# Patient Record
Sex: Female | Born: 1980 | Race: White | Hispanic: No | Marital: Single | Smoking: Current every day smoker
Health system: Southern US, Community
[De-identification: ages and names within clinical notes are randomized; demographics above are authoritative.]

## PROBLEM LIST (undated history)

## (undated) DIAGNOSIS — G459 Transient cerebral ischemic attack, unspecified: Secondary | ICD-10-CM

## (undated) DIAGNOSIS — F4310 Post-traumatic stress disorder, unspecified: Secondary | ICD-10-CM

## (undated) DIAGNOSIS — F329 Major depressive disorder, single episode, unspecified: Secondary | ICD-10-CM

## (undated) DIAGNOSIS — Z915 Personal history of self-harm: Secondary | ICD-10-CM

## (undated) DIAGNOSIS — F419 Anxiety disorder, unspecified: Secondary | ICD-10-CM

## (undated) DIAGNOSIS — G479 Sleep disorder, unspecified: Secondary | ICD-10-CM

## (undated) DIAGNOSIS — J45909 Unspecified asthma, uncomplicated: Secondary | ICD-10-CM

## (undated) DIAGNOSIS — T7421XA Adult sexual abuse, confirmed, initial encounter: Secondary | ICD-10-CM

## (undated) DIAGNOSIS — F32A Depression, unspecified: Secondary | ICD-10-CM

## (undated) DIAGNOSIS — K589 Irritable bowel syndrome without diarrhea: Secondary | ICD-10-CM

## (undated) DIAGNOSIS — K219 Gastro-esophageal reflux disease without esophagitis: Secondary | ICD-10-CM

## (undated) DIAGNOSIS — Z9151 Personal history of suicidal behavior: Secondary | ICD-10-CM

## (undated) HISTORY — PX: ABDOMINAL HYSTERECTOMY: SHX81

## (undated) HISTORY — PX: ANKLE SURGERY: SHX546

## (undated) HISTORY — PX: CHOLECYSTECTOMY: SHX55

---

## 2016-03-02 ENCOUNTER — Ambulatory Visit (INDEPENDENT_AMBULATORY_CARE_PROVIDER_SITE_OTHER): Payer: No Typology Code available for payment source

## 2016-03-02 ENCOUNTER — Encounter: Payer: Self-pay | Admitting: Gynecology

## 2016-03-02 ENCOUNTER — Ambulatory Visit
Admission: EM | Admit: 2016-03-02 | Discharge: 2016-03-02 | Disposition: A | Discharge status: Home / Self Care | Payer: No Typology Code available for payment source | Attending: Family Medicine | Admitting: Family Medicine

## 2016-03-02 DIAGNOSIS — S8391XA Sprain of unspecified site of right knee, initial encounter: Secondary | ICD-10-CM

## 2016-03-02 DIAGNOSIS — S93401A Sprain of unspecified ligament of right ankle, initial encounter: Secondary | ICD-10-CM

## 2016-03-02 HISTORY — DX: Gastro-esophageal reflux disease without esophagitis: K21.9

## 2016-03-02 HISTORY — DX: Sleep disorder, unspecified: G47.9

## 2016-03-02 HISTORY — DX: Irritable bowel syndrome, unspecified: K58.9

## 2016-03-02 HISTORY — DX: Major depressive disorder, single episode, unspecified: F32.9

## 2016-03-02 HISTORY — DX: Depression, unspecified: F32.A

## 2016-03-02 HISTORY — DX: Anxiety disorder, unspecified: F41.9

## 2016-03-02 MED ORDER — HYDROCODONE-ACETAMINOPHEN 5-325 MG PO TABS: ORAL_TABLET | ORAL | 0 refills | Status: DC

## 2016-03-02 MED ORDER — KETOROLAC TROMETHAMINE 60 MG/2ML IM SOLN
60.0000 mg | Freq: Once | INTRAMUSCULAR | Status: AC
  Administered 2016-03-02: 60 mg via INTRAMUSCULAR

## 2016-03-02 NOTE — ED Triage Notes (Signed)
Per patient fall at dad home. Patient stated hole in grass that she did not see and her right ankle got twisted. Per patients/p right ankle broken in December 2015. Patient stated has plate, screw and pins in right knee. Per patient just move x 3 days ago from [place].

## 2016-03-02 NOTE — ED Provider Notes (Signed)
[facility]    CSN: [id number] Arrival date & time: 03/02/16  1542  First Provider Contact:  First MD Initiated Contact with Patient 03/02/16 1632        History   Chief Complaint Chief Complaint  Patient presents with  . Fall  . Knee Pain    HPI Dynette Maginnis is a 35 y.o. female.   35 yo female with a c/o right knee and ankle pain after tripping in a hole in her father's yard and twisting her ankle and knee. States knee pain is worse. Denies any numbness/tingling, laceration.    The history is provided by the patient.  Fall  This is a new problem.  Knee Pain    Past Medical History:  Diagnosis Date  . Anxiety   . Depression   . GERD (gastroesophageal reflux disease)   . IBS (irritable bowel syndrome)   . Sleep disorder     There are no active problems to display for this patient.   Past Surgical History:  Procedure Laterality Date  . ANKLE SURGERY Right   . CHOLECYSTECTOMY      OB History    No data available       Home Medications    Prior to Admission medications   Medication Sig Start Date End Date Taking? Authorizing Provider  busPIRone (BUSPAR) 10 MG tablet Take 10 mg by mouth 3 (three) times daily.   Yes Historical Provider, MD  dicyclomine (BENTYL) 20 MG tablet Take 20 mg by mouth every 6 (six) hours.   Yes Historical Provider, MD  escitalopram (LEXAPRO) 20 MG tablet Take 20 mg by mouth daily.   Yes Historical Provider, MD  gabapentin (NEURONTIN) 600 MG tablet Take 600 mg by mouth 3 (three) times daily.   Yes Historical Provider, MD  hydrALAZINE (APRESOLINE) 50 MG tablet Take 50 mg by mouth 3 (three) times daily.   Yes Historical Provider, MD  pantoprazole (PROTONIX) 40 MG tablet Take 40 mg by mouth daily.   Yes Historical Provider, MD  propranolol (INDERAL) 10 MG tablet Take 10 mg by mouth 3 (three) times daily.   Yes Historical Provider, MD  SUMAtriptan (IMITREX) 100 MG tablet Take 100 mg by mouth every 2 (two) hours as needed for  migraine. May repeat in 2 hours if headache persists or recurs.   Yes Historical Provider, MD  topiramate (TOPAMAX) 200 MG tablet Take 200 mg by mouth 2 (two) times daily.   Yes Historical Provider, MD  traZODone (DESYREL) 100 MG tablet Take 100 mg by mouth at bedtime.   Yes Historical Provider, MD  HYDROcodone-acetaminophen (NORCO/VICODIN) 5-325 MG tablet 1-2 tabs po q 8 hours prn 03/02/16   Payton Mccallum, MD    Family History No family history on file.  Social History Social History  Substance Use Topics  . Smoking status: Former Games developer  . Smokeless tobacco: Never Used  . Alcohol use No     Allergies   Prozac [fluoxetine hcl]   Review of Systems Review of Systems   Physical Exam Triage Vital Signs ED Triage Vitals  Enc Vitals Group     BP 03/02/16 1616 101/78     Pulse Rate 03/02/16 1616 67     Resp 03/02/16 1616 16     Temp 03/02/16 1616 98.7 F (37.1 C)     Temp Source 03/02/16 1616 Oral     SpO2 03/02/16 1616 99 %     Weight 03/02/16 1616 200 lb (90.7 kg)  Height 03/02/16 1616  (1.727 m)     Head Circumference --      Peak Flow --      Pain Score 03/02/16 1630 8     Pain Loc --      Pain Edu? --      Excl. in GC? --    No data found.   Updated Vital Signs BP 101/78 (BP Location: Left Arm)   Pulse 67   Temp 98.7 F (37.1 C) (Oral)   Resp 16   Ht  (1.727 m)   Wt 200 lb (90.7 kg)   SpO2 99%   BMI 30.41 kg/m   Visual Acuity Right Eye Distance:   Left Eye Distance:   Bilateral Distance:    Right Eye Near:   Left Eye Near:    Bilateral Near:     Physical Exam  Constitutional: She appears well-developed and well-nourished. No distress.  Musculoskeletal:       Right knee: She exhibits decreased range of motion and swelling (mild). She exhibits no effusion, no ecchymosis, no deformity, no laceration, no erythema, normal alignment, no LCL laxity, normal patellar mobility, no bony tenderness, normal meniscus and no MCL laxity. Tenderness  (diffuse) found.       Right ankle: She exhibits decreased range of motion and swelling (mild). She exhibits no ecchymosis, no deformity, no laceration and normal pulse. Tenderness (diffuse). Achilles tendon normal.  Skin: She is not diaphoretic.  Nursing note and vitals reviewed.    UC Treatments / Results  Labs (all labs ordered are listed, but only abnormal results are displayed) Labs Reviewed - No data to display  EKG  EKG Interpretation None       Radiology Dg Ankle Complete Right  Result Date: 03/02/2016 CLINICAL DATA:  Recent fall with right ankle pain, initial encounter EXAM: RIGHT ANKLE - COMPLETE 3+ VIEW COMPARISON:  None. FINDINGS: Postsurgical changes are noted with 2 fixation screws traversing the medial malleolus as well as a tunnel fixation device involving the distal tibia and fibula. No acute fracture or dislocation is noted. No soft tissue changes are seen. IMPRESSION: Postsurgical change without acute abnormality. Electronically Signed   By: Alcide Clever M.D.   On: 03/02/2016 17:26  Dg Knee Complete 4 Views Right  Result Date: 03/02/2016 CLINICAL DATA:  Pain following fall EXAM: RIGHT KNEE - COMPLETE 4+ VIEW COMPARISON:  None. FINDINGS: Frontal, lateral, bilateral oblique, and sunrise patellar images were obtained. There is no demonstrable fracture or dislocation. No joint effusion. The joint spaces appear normal. No erosive change. Nonfusion of the anterior tibial tubercle is an anatomic variant. IMPRESSION: No fracture or joint effusion.  No apparent arthropathy. Electronically Signed   By: Bretta Bang III M.D.   On: 03/02/2016 17:29   Procedures Procedures (including critical care time)  Medications Ordered in UC Medications  ketorolac (TORADOL) injection 60 mg (60 mg Intramuscular Given 03/02/16 1644)     Initial Impression / Assessment and Plan / UC Course  I have reviewed the triage vital signs and the nursing notes.  Pertinent labs & imaging  results that were available during my care of the patient were reviewed by me and considered in my medical decision making (see chart for details).  Clinical Course      Final Clinical Impressions(s) / UC Diagnoses   Final diagnoses:  Knee sprain, right, initial encounter  Ankle sprain, right, initial encounter    New Prescriptions Discharge Medication List as of 03/02/2016  5:46  PM    START taking these medications   Details  HYDROcodone-acetaminophen (NORCO/VICODIN) 5-325 MG tablet 1-2 tabs po q 8 hours prn, Print      1. x-ray results and diagnosis reviewed with patient 2. rx as per orders above; reviewed possible side effects, interactions, risks and benefits  3. Recommend supportive treatment with rest, ice, elevation 4. Follow-up prn if symptoms worsen or don't improve   Payton Mccallum, MD 03/02/16 2023

## 2016-05-30 ENCOUNTER — Ambulatory Visit
Admission: EM | Admit: 2016-05-30 | Discharge: 2016-05-30 | Disposition: A | Discharge status: Home / Self Care | Payer: Non-veteran care | Attending: Family Medicine | Admitting: Family Medicine

## 2016-05-30 DIAGNOSIS — G43709 Chronic migraine without aura, not intractable, without status migrainosus: Secondary | ICD-10-CM | POA: Diagnosis not present

## 2016-05-30 HISTORY — DX: Transient cerebral ischemic attack, unspecified: G45.9

## 2016-05-30 MED ORDER — DEXAMETHASONE SODIUM PHOSPHATE 10 MG/ML IJ SOLN
10.0000 mg | Freq: Once | INTRAMUSCULAR | Status: AC
  Administered 2016-05-30: 10 mg via INTRAMUSCULAR

## 2016-05-30 MED ORDER — ONDANSETRON 8 MG PO TBDP
8.0000 mg | ORAL_TABLET | Freq: Once | ORAL | Status: AC
  Administered 2016-05-30: 8 mg via ORAL

## 2016-05-30 MED ORDER — DIPHENHYDRAMINE HCL 50 MG/ML IJ SOLN
50.0000 mg | Freq: Once | INTRAMUSCULAR | Status: AC
  Administered 2016-05-30: 50 mg via INTRAMUSCULAR

## 2016-05-30 MED ORDER — BUTALBITAL-APAP-CAFFEINE 50-325-40 MG PO TABS: 1.0000 | ORAL_TABLET | Freq: Four times a day (QID) | ORAL | 0 refills | Status: DC | PRN

## 2016-05-30 NOTE — ED Provider Notes (Signed)
CSN: [id number]     Arrival date & time 05/30/16  1200 History   First MD Initiated Contact with Patient 05/30/16 1333     Chief Complaint  Patient presents with  . Migraine   (Consider location/radiation/quality/duration/timing/severity/associated sxs/prior Treatment) HPI  Present with migraine Ha for 3 days unresponsive to Imitrex and NSAIDs at home. Rates as 8/10 at present. Nausea and vomiting. Not worse in her life but" up there".       Past Medical History:  Diagnosis Date  . Anxiety   . Depression   . GERD (gastroesophageal reflux disease)   . IBS (irritable bowel syndrome)   . Sleep disorder   . TIA (transient ischemic attack)    Past Surgical History:  Procedure Laterality Date  . ANKLE SURGERY Right   . CHOLECYSTECTOMY     History reviewed. No pertinent family history. Social History  Substance Use Topics  . Smoking status: Former Games developermoker  . Smokeless tobacco: Never Used  . Alcohol use No   OB History    No data available     Review of Systems  Constitutional: Positive for activity change. Negative for chills, fatigue and fever.  HENT: Positive for sinus pressure.   Neurological: Positive for weakness and headaches.  All other systems reviewed and are negative.   Allergies  Haldol [haloperidol lactate] and Prozac [fluoxetine hcl]  Home Medications   Prior to Admission medications   Medication Sig Start Date End Date Taking? Authorizing Provider  SUMAtriptan (IMITREX) 100 MG tablet Take 100 mg by mouth every 2 (two) hours as needed for migraine. May repeat in 2 hours if headache persists or recurs.   Yes Historical Provider, MD  topiramate (TOPAMAX) 200 MG tablet Take 200 mg by mouth 2 (two) times daily.   Yes Historical Provider, MD  busPIRone (BUSPAR) 10 MG tablet Take 10 mg by mouth 3 (three) times daily.    Historical Provider, MD  butalbital-acetaminophen-caffeine (FIORICET, ESGIC) 50-325-40 MG tablet Take 1-2 tablets by mouth every 6 (six) hours  as needed for headache. 05/30/16 05/30/17  Lutricia FeilWilliam P Epimenio Schetter, PA-C  dicyclomine (BENTYL) 20 MG tablet Take 20 mg by mouth every 6 (six) hours.    Historical Provider, MD  escitalopram (LEXAPRO) 20 MG tablet Take 20 mg by mouth daily.    Historical Provider, MD  gabapentin (NEURONTIN) 600 MG tablet Take 600 mg by mouth 3 (three) times daily.    Historical Provider, MD  hydrALAZINE (APRESOLINE) 50 MG tablet Take 50 mg by mouth 3 (three) times daily.    Historical Provider, MD  HYDROcodone-acetaminophen (NORCO/VICODIN) 5-325 MG tablet 1-2 tabs po q 8 hours prn 03/02/16   Payton Mccallumrlando Conty, MD  pantoprazole (PROTONIX) 40 MG tablet Take 40 mg by mouth daily.    Historical Provider, MD  propranolol (INDERAL) 10 MG tablet Take 10 mg by mouth 3 (three) times daily.    Historical Provider, MD  traZODone (DESYREL) 100 MG tablet Take 100 mg by mouth at bedtime.    Historical Provider, MD   Meds Ordered and Administered this Visit   Medications  dexamethasone (DECADRON) injection 10 mg (10 mg Intramuscular Given 05/30/16 1357)  diphenhydrAMINE (BENADRYL) injection 50 mg (50 mg Intramuscular Given 05/30/16 1357)  ondansetron (ZOFRAN-ODT) disintegrating tablet 8 mg (8 mg Oral Given 05/30/16 1357)    BP 108/65 (BP Location: Left Arm)   Pulse 82   Temp 98.2 F (36.8 C) (Oral)   Resp 18   Ht 5\' 9"  (1.753 m)   Wt  200 lb (90.7 kg)   SpO2 97%   BMI 29.53 kg/m  No data found.   Physical Exam  Constitutional: She is oriented to person, place, and time. She appears well-developed and well-nourished. No distress.  HENT:  Head: Normocephalic and atraumatic.  Right Ear: External ear normal.  Left Ear: External ear normal.  Eyes: EOM are normal. Pupils are equal, round, and reactive to light. Right eye exhibits no discharge. Left eye exhibits no discharge.  Neck: Normal range of motion. Neck supple.  Musculoskeletal: Normal range of motion.  Neurological: She is alert and oriented to person, place, and time.  No cranial nerve deficit. She exhibits normal muscle tone. Coordination normal.  Skin: Skin is warm and dry. She is not diaphoretic.  Psychiatric: She has a normal mood and affect. Her behavior is normal. Judgment and thought content normal.  Nursing note and vitals reviewed.   Urgent Care Course   Clinical Course    Procedures (including critical care time)  Labs Review Labs Reviewed - No data to display  Imaging Review No results found.   Visual Acuity Review  Right Eye Distance:   Left Eye Distance:   Bilateral Distance:    Right Eye Near:   Left Eye Near:    Bilateral Near:     Medications  dexamethasone (DECADRON) injection 10 mg (10 mg Intramuscular Given 05/30/16 1357)  diphenhydrAMINE (BENADRYL) injection 50 mg (50 mg Intramuscular Given 05/30/16 1357)  ondansetron (ZOFRAN-ODT) disintegrating tablet 8 mg (8 mg Oral Given 05/30/16 1357)   Patient did not have any relief of pain after 30 minutes following the medication. Continues to rate her headache is an 8 out of 10   MDM   1. Chronic migraine without aura without status migrainosus, not intractable    Discharge Medication List as of 05/30/2016  2:43 PM    START taking these medications   Details  butalbital-acetaminophen-caffeine (FIORICET, ESGIC) 50-325-40 MG tablet Take 1-2 tablets by mouth every 6 (six) hours as needed for headache., Starting Sun 05/30/2016, Until Mon 05/30/2017, Print      Plan: 1. Test/x-ray results and diagnosis reviewed with patient 2. rx as per orders; risks, benefits, potential side effects reviewed with patient 3. Recommend supportive treatment with  Use of prescribed medicine if after one dose does not feel improvement I recommended highly she go to the emergency room. Recommended specifically that she go the emergency room from here but she declined saying she had no way of getting there are father would not take her and she has no friends since she just recently moved to the  area. Spite my recommendation that she go the emergency room she wanted only to have a medication that she can take at home. When I did prescribe the Fioricet   she researched [employer] and stated that he read that Fioricet with codeine works better. I told her I will not prescribe codeine for her and she will have to settle for Fioricet but again reiterated that she should go to the emergency room.   Lutricia Feil, PA-C 05/30/16 1657    Lutricia Feil, PA-C 05/30/16 [phone]

## 2016-05-30 NOTE — ED Triage Notes (Signed)
Pt complains of a migraine for about 3 days. Now her neck is sore and lower back.

## 2016-10-15 ENCOUNTER — Ambulatory Visit (INDEPENDENT_AMBULATORY_CARE_PROVIDER_SITE_OTHER): Payer: Non-veteran care

## 2016-10-15 ENCOUNTER — Ambulatory Visit
Admission: EM | Admit: 2016-10-15 | Discharge: 2016-10-15 | Disposition: A | Discharge status: Home / Self Care | Payer: Non-veteran care | Attending: Family Medicine | Admitting: Family Medicine

## 2016-10-15 DIAGNOSIS — F329 Major depressive disorder, single episode, unspecified: Secondary | ICD-10-CM | POA: Diagnosis not present

## 2016-10-15 DIAGNOSIS — K219 Gastro-esophageal reflux disease without esophagitis: Secondary | ICD-10-CM | POA: Insufficient documentation

## 2016-10-15 DIAGNOSIS — R079 Chest pain, unspecified: Secondary | ICD-10-CM | POA: Diagnosis present

## 2016-10-15 DIAGNOSIS — R05 Cough: Secondary | ICD-10-CM

## 2016-10-15 DIAGNOSIS — F419 Anxiety disorder, unspecified: Secondary | ICD-10-CM | POA: Diagnosis not present

## 2016-10-15 DIAGNOSIS — Z79899 Other long term (current) drug therapy: Secondary | ICD-10-CM | POA: Diagnosis not present

## 2016-10-15 DIAGNOSIS — Z888 Allergy status to other drugs, medicaments and biological substances status: Secondary | ICD-10-CM | POA: Diagnosis not present

## 2016-10-15 DIAGNOSIS — R091 Pleurisy: Secondary | ICD-10-CM | POA: Insufficient documentation

## 2016-10-15 DIAGNOSIS — M940 Chondrocostal junction syndrome [Tietze]: Secondary | ICD-10-CM

## 2016-10-15 DIAGNOSIS — Z8673 Personal history of transient ischemic attack (TIA), and cerebral infarction without residual deficits: Secondary | ICD-10-CM | POA: Diagnosis not present

## 2016-10-15 DIAGNOSIS — K589 Irritable bowel syndrome without diarrhea: Secondary | ICD-10-CM | POA: Diagnosis not present

## 2016-10-15 DIAGNOSIS — Z87891 Personal history of nicotine dependence: Secondary | ICD-10-CM | POA: Diagnosis not present

## 2016-10-15 LAB — CBC WITH DIFFERENTIAL/PLATELET
BASOS ABS: 0.1 10*3/uL (ref 0–0.1)
Basophils Relative: 1 %
EOS PCT: 3 %
Eosinophils Absolute: 0.2 10*3/uL (ref 0–0.7)
HCT: 38.9 % (ref 35.0–47.0)
HEMOGLOBIN: 13.1 g/dL (ref 12.0–16.0)
LYMPHS PCT: 28 %
Lymphs Abs: 1.9 10*3/uL (ref 1.0–3.6)
MCH: 30.9 pg (ref 26.0–34.0)
MCHC: 33.6 g/dL (ref 32.0–36.0)
MCV: 91.9 fL (ref 80.0–100.0)
Monocytes Absolute: 0.5 10*3/uL (ref 0.2–0.9)
Monocytes Relative: 7 %
NEUTROS PCT: 61 %
Neutro Abs: 4 10*3/uL (ref 1.4–6.5)
PLATELETS: 183 10*3/uL (ref 150–440)
RBC: 4.23 MIL/uL (ref 3.80–5.20)
RDW: 14.5 % (ref 11.5–14.5)
WBC: 6.7 10*3/uL (ref 3.6–11.0)

## 2016-10-15 LAB — COMPREHENSIVE METABOLIC PANEL
ALT: 28 U/L (ref 14–54)
AST: 29 U/L (ref 15–41)
Albumin: 4 g/dL (ref 3.5–5.0)
Alkaline Phosphatase: 87 U/L (ref 38–126)
Anion gap: 8 (ref 5–15)
BUN: 15 mg/dL (ref 6–20)
CHLORIDE: 103 mmol/L (ref 101–111)
CO2: 26 mmol/L (ref 22–32)
CREATININE: 1.02 mg/dL — AB (ref 0.44–1.00)
Calcium: 8.8 mg/dL — ABNORMAL LOW (ref 8.9–10.3)
GFR calc Af Amer: >60 mL/min (ref >60)
GFR calc non Af Amer: >60 mL/min (ref >60)
Glucose, Bld: 106 mg/dL — ABNORMAL HIGH (ref 65–99)
Potassium: 4 mmol/L (ref 3.5–5.1)
SODIUM: 137 mmol/L (ref 135–145)
Total Bilirubin: 0.2 mg/dL — ABNORMAL LOW (ref 0.3–1.2)
Total Protein: 6.8 g/dL (ref 6.5–8.1)

## 2016-10-15 LAB — URINALYSIS, COMPLETE (UACMP) WITH MICROSCOPIC
BACTERIA UA: NONE SEEN
Bilirubin Urine: NEGATIVE
Glucose, UA: NEGATIVE mg/dL
KETONES UR: NEGATIVE mg/dL
Leukocytes, UA: NEGATIVE
Nitrite: NEGATIVE
PROTEIN: NEGATIVE mg/dL
Specific Gravity, Urine: >1.03 — ABNORMAL HIGH (ref 1.005–1.030)
pH: 5.5 (ref 5.0–8.0)

## 2016-10-15 LAB — FIBRIN DERIVATIVES D-DIMER (ARMC ONLY): Fibrin derivatives D-dimer (ARMC): 337.21 (ref 0.00–499.00)

## 2016-10-15 MED ORDER — HYDROCODONE-ACETAMINOPHEN 5-325 MG PO TABS: ORAL_TABLET | ORAL | 0 refills | Status: DC

## 2016-10-15 MED ORDER — PROMETHAZINE HCL 25 MG/ML IJ SOLN
25.0000 mg | Freq: Once | INTRAMUSCULAR | Status: AC
  Administered 2016-10-15: 25 mg via INTRAMUSCULAR

## 2016-10-15 MED ORDER — KETOROLAC TROMETHAMINE 60 MG/2ML IM SOLN
60.0000 mg | Freq: Once | INTRAMUSCULAR | Status: AC
  Administered 2016-10-15: 60 mg via INTRAMUSCULAR

## 2016-10-15 MED ORDER — PREDNISONE 20 MG PO TABS: 20.0000 mg | ORAL_TABLET | Freq: Every day | ORAL | 0 refills | Status: DC

## 2016-10-15 MED ORDER — ACETAMINOPHEN 500 MG PO TABS
1000.0000 mg | ORAL_TABLET | Freq: Once | ORAL | Status: AC
Start: 2016-10-15 — End: 2016-10-15
  Administered 2016-10-15: 1000 mg via ORAL

## 2016-10-15 MED ORDER — AZITHROMYCIN 250 MG PO TABS: ORAL_TABLET | ORAL | 0 refills | Status: DC

## 2016-10-15 MED ORDER — PROMETHAZINE HCL 25 MG/ML IJ SOLN: 25.0000 mg | Freq: Once | INTRAMUSCULAR | Status: DC

## 2016-10-15 NOTE — ED Triage Notes (Signed)
Pt c/o sharp chest pain on the right side, she says it started about an hour ago and just keeps getting worse. Has history of pleurisy for about 2 months. She is just getting over this.

## 2016-12-15 NOTE — ED Provider Notes (Addendum)
[facility]    CSN: [id number] Arrival date & time: 10/15/16  1712     History   Chief Complaint Chief Complaint  Patient presents with  . Chest Pain    HPI Kylie Lam is a 36 y.o. female.   The history is provided by the patient.  Chest Pain  Pain location:  R chest Pain quality: sharp   Pain severity:  Moderate Onset quality:  Sudden Duration:  3 hours Timing:  Constant Progression:  Worsening Chronicity:  New Context: breathing, movement and at rest   Context: not drug use, not eating, not intercourse, not lifting, not raising an arm, not stress and not trauma   Relieved by:  None tried Worsened by:  Deep breathing, coughing and movement Associated symptoms: cough   Associated symptoms: no abdominal pain, no AICD problem, no altered mental status, no anorexia, no anxiety, no back pain, no claudication, no diaphoresis, no dizziness, no dysphagia, no fatigue, no fever, no headache, no heartburn, no lower extremity edema, no nausea, no near-syncope, no numbness, no orthopnea, no palpitations, no PND, no shortness of breath, no syncope, no vomiting and no weakness   Risk factors: no aortic disease, no birth control, no coronary artery disease, no diabetes mellitus, no Ehlers-Danlos syndrome, no high cholesterol, no hypertension, no immobilization, not female, no Marfan's syndrome, not obese, not pregnant, no prior DVT/PE, no smoking and no surgery     Past Medical History:  Diagnosis Date  . Anxiety   . Depression   . GERD (gastroesophageal reflux disease)   . IBS (irritable bowel syndrome)   . Sleep disorder   . TIA (transient ischemic attack)     There are no active problems to display for this patient.   Past Surgical History:  Procedure Laterality Date  . ANKLE SURGERY Right   . CHOLECYSTECTOMY      OB History    No data available       Home Medications    Prior to Admission medications   Medication Sig Start Date End Date Taking?  Authorizing Provider  busPIRone (BUSPAR) 10 MG tablet Take 10 mg by mouth 3 (three) times daily.   Yes [provider]  dicyclomine (BENTYL) 20 MG tablet Take 20 mg by mouth every 6 (six) hours.   Yes [provider]  escitalopram (LEXAPRO) 20 MG tablet Take 20 mg by mouth daily.   Yes [provider]  gabapentin (NEURONTIN) 600 MG tablet Take 600 mg by mouth 3 (three) times daily.   Yes [provider]  pantoprazole (PROTONIX) 40 MG tablet Take 40 mg by mouth daily.   Yes [provider]  SUMAtriptan (IMITREX) 100 MG tablet Take 100 mg by mouth every 2 (two) hours as needed for migraine. May repeat in 2 hours if headache persists or recurs.   Yes [provider]  topiramate (TOPAMAX) 200 MG tablet Take 200 mg by mouth 2 (two) times daily.   Yes [provider]  traZODone (DESYREL) 100 MG tablet Take 100 mg by mouth at bedtime.   Yes [provider]  azithromycin (ZITHROMAX Z-PAK) 250 MG tablet 2 tabs po once day 1, then 1 tab po qd for next 4 days 10/15/16   Payton Mccallum, MD  HYDROcodone-acetaminophen (NORCO/VICODIN) 5-325 MG tablet 1-2 tabs po bid prn 10/15/16   Payton Mccallum, MD  predniSONE (DELTASONE) 20 MG tablet Take 1 tablet (20 mg total) by mouth daily. 10/15/16   Payton Mccallum, MD  Family History History reviewed. No pertinent family history.  Social History Social History  Substance Use Topics  . Smoking status: Former Smoker  . Smokeless tobaGames developercco: Never Used  . Alcohol use No     Allergies   Haldol [haloperidol lactate] and Prozac [fluoxetine hcl]   Review of Systems Review of Systems  Constitutional: Negative for diaphoresis, fatigue and fever.  HENT: Negative for trouble swallowing.   Respiratory: Positive for cough. Negative for shortness of breath.   Cardiovascular: Positive for chest pain. Negative for palpitations, orthopnea, claudication, syncope, PND and near-syncope.  Gastrointestinal:  Negative for abdominal pain, anorexia, heartburn, nausea and vomiting.  Musculoskeletal: Negative for back pain.  Neurological: Negative for dizziness, weakness, numbness and headaches.     Physical Exam Triage Vital Signs ED Triage Vitals  Enc Vitals Group     BP 10/15/16 1715 109/71     Pulse Rate 10/15/16 1715 85     Resp 10/15/16 1715 18     Temp 10/15/16 1715 98 F (36.7 C)     Temp Source 10/15/16 1715 Oral     SpO2 10/15/16 1715 98 %     Weight 10/15/16 1714 190 lb (86.2 kg)     Height 10/15/16 1713 5\' 9"  (1.753 m)     Head Circumference --      Peak Flow --      Pain Score 10/15/16 1716 10     Pain Loc --      Pain Edu? --      Excl. in GC? --    No data found.   Updated Vital Signs BP 109/71 (BP Location: Right Arm)   Pulse 85   Temp 98 F (36.7 C) (Oral)   Resp 18   Ht 5\' 9"  (1.753 m)   Wt 190 lb (86.2 kg)   SpO2 98%   BMI 28.06 kg/m   Visual Acuity Right Eye Distance:   Left Eye Distance:   Bilateral Distance:    Right Eye Near:   Left Eye Near:    Bilateral Near:     Physical Exam  Constitutional: She appears well-developed and well-nourished. No distress.  HENT:  Head: Normocephalic and atraumatic.  Right Ear: Tympanic membrane, external ear and ear canal normal.  Left Ear: Tympanic membrane, external ear and ear canal normal.  Nose: No mucosal edema, rhinorrhea, nose lacerations, sinus tenderness, nasal deformity, septal deviation or nasal septal hematoma. No epistaxis.  No foreign bodies. Right sinus exhibits no maxillary sinus tenderness and no frontal sinus tenderness. Left sinus exhibits no maxillary sinus tenderness and no frontal sinus tenderness.  Mouth/Throat: Uvula is midline, oropharynx is clear and moist and mucous membranes are normal. No oropharyngeal exudate.  Eyes: Conjunctivae and EOM are normal. Pupils are equal, round, and reactive to light. Right eye exhibits no discharge. Left eye exhibits no discharge. No scleral icterus.    Neck: Normal range of motion. Neck supple. No thyromegaly present.  Cardiovascular: Normal rate, regular rhythm and normal heart sounds.   Pulmonary/Chest: Effort normal. No respiratory distress. She has no wheezes. She has no rales.  Rhonchi right base  Lymphadenopathy:    She has no cervical adenopathy.  Skin: She is not diaphoretic. No erythema.  Nursing note and vitals reviewed.    UC Treatments / Results  Labs (all labs ordered are listed, but only abnormal results are displayed) Labs Reviewed  COMPREHENSIVE METABOLIC PANEL - Abnormal; Notable for the following:       Result Value  Glucose, Bld 106 (*)    Creatinine, Ser 1.02 (*)    Calcium 8.8 (*)    Total Bilirubin 0.2 (*)    All other components within normal limits  URINALYSIS, COMPLETE (UACMP) WITH MICROSCOPIC - Abnormal; Notable for the following:    Specific Gravity, Urine >1.030 (*)    Hgb urine dipstick TRACE (*)    Squamous Epithelial / LPF 0-5 (*)    All other components within normal limits  CBC WITH DIFFERENTIAL/PLATELET  FIBRIN DERIVATIVES D-DIMER [facility] ONLY)    EKG  EKG Interpretation None       Radiology No results found.  Procedures .EKG Date/Time: 12/15/2016 6:44 PM Performed by: Payton Mccallum Authorized by: Payton Mccallum   ECG reviewed by ED Physician in the absence of a cardiologist: yes   Previous ECG:    Previous ECG:  Unavailable Interpretation:    Interpretation: normal   Rate:    ECG rate assessment: normal   Rhythm:    Rhythm: sinus rhythm   Ectopy:    Ectopy: none   QRS:    QRS axis:  Normal Conduction:    Conduction: normal   ST segments:    ST segments:  Normal T waves:    T waves: normal       (including critical care time)  Medications Ordered in UC Medications  ketorolac (TORADOL) injection 60 mg (60 mg Intramuscular Given 10/15/16 1742)  acetaminophen (TYLENOL) tablet 1,000 mg (1,000 mg Oral Given 10/15/16 1835)  promethazine (PHENERGAN) injection 25 mg  (25 mg Intramuscular Given 10/15/16 1920)     Initial Impression / Assessment and Plan / UC Course  I have reviewed the triage vital signs and the nursing notes.  Pertinent labs & imaging results that were available during my care of the patient were reviewed by me and considered in my medical decision making (see chart for details).      Final Clinical Impressions(s) / UC Diagnoses   Final diagnoses:  Pleurisy  Costochondritis    New Prescriptions Discharge Medication List as of 10/15/2016  7:19 PM    START taking these medications   Details  azithromycin (ZITHROMAX Z-PAK) 250 MG tablet 2 tabs po once day 1, then 1 tab po qd for next 4 days, Normal    HYDROcodone-acetaminophen (NORCO/VICODIN) 5-325 MG tablet 1-2 tabs po bid prn, Print    predniSONE (DELTASONE) 20 MG tablet Take 1 tablet (20 mg total) by mouth daily., Starting Fri 10/15/2016, Normal       1. Labs/x-ray/ekg results and diagnosis reviewed with patient 2. Given toradol 60mg  im x1 and phenergan 25mg  im x 1 3. rx as per orders above; reviewed possible side effects, interactions, risks and benefits  3. Recommend supportive treatment with rest, ice 4. Follow-up prn if symptoms worsen or don't improve   Payton Mccallum, MD 12/15/16 1850     Payton Mccallum, MD 12/15/16 Lynelle Smoke    Payton Mccallum, MD 12/15/16 [phone]

## 2016-12-31 ENCOUNTER — Ambulatory Visit
Admission: EM | Admit: 2016-12-31 | Discharge: 2016-12-31 | Disposition: A | Discharge status: Home / Self Care | Payer: Non-veteran care | Attending: Internal Medicine | Admitting: Internal Medicine

## 2016-12-31 ENCOUNTER — Encounter: Payer: Self-pay | Admitting: *Deleted

## 2016-12-31 ENCOUNTER — Ambulatory Visit (INDEPENDENT_AMBULATORY_CARE_PROVIDER_SITE_OTHER): Payer: Non-veteran care

## 2016-12-31 DIAGNOSIS — W19XXXA Unspecified fall, initial encounter: Secondary | ICD-10-CM | POA: Diagnosis not present

## 2016-12-31 DIAGNOSIS — M25522 Pain in left elbow: Secondary | ICD-10-CM

## 2016-12-31 DIAGNOSIS — S5002XA Contusion of left elbow, initial encounter: Secondary | ICD-10-CM | POA: Diagnosis not present

## 2016-12-31 MED ORDER — KETOROLAC TROMETHAMINE 60 MG/2ML IM SOLN
60.0000 mg | Freq: Once | INTRAMUSCULAR | Status: AC
  Administered 2016-12-31: 60 mg via INTRAMUSCULAR

## 2016-12-31 MED ORDER — HYDROCODONE-ACETAMINOPHEN 5-325 MG PO TABS: 1.0000 | ORAL_TABLET | Freq: Four times a day (QID) | ORAL | 0 refills | Status: DC | PRN

## 2016-12-31 MED ORDER — NAPROXEN 500 MG PO TABS: 500.0000 mg | ORAL_TABLET | Freq: Two times a day (BID) | ORAL | 0 refills | Status: DC

## 2016-12-31 NOTE — ED Provider Notes (Signed)
CSN: [id number]     Arrival date & time 12/31/16  1122 History   First MD Initiated Contact with Patient 12/31/16 1159     Chief Complaint  Patient presents with  . Joint Swelling   (Consider location/radiation/quality/duration/timing/severity/associated sxs/prior Treatment) HPI  This a 36 year old female who states that she fell yesterday when she was watering her father's yard. Her left ankle twisted into eversion she lost her balance falling onto her left nondominant elbow. She had pain immediately that has persisted since that time. She has been using ice and elevation. She states that she does not have a strong grip strength on that arm painful with any motion.        Past Medical History:  Diagnosis Date  . Anxiety   . Depression   . GERD (gastroesophageal reflux disease)   . IBS (irritable bowel syndrome)   . Sleep disorder   . TIA (transient ischemic attack)    Past Surgical History:  Procedure Laterality Date  . ANKLE SURGERY Right   . CHOLECYSTECTOMY     History reviewed. No pertinent family history. Social History  Substance Use Topics  . Smoking status: Current Every Day Smoker    Packs/day: 1.00    Types: Cigarettes  . Smokeless tobacco: Never Used  . Alcohol use No   OB History    No data available     Review of Systems  Constitutional: Positive for activity change. Negative for chills, fatigue and fever.  Musculoskeletal: Positive for arthralgias and joint swelling.  All other systems reviewed and are negative.   Allergies  Haldol [haloperidol lactate] and Prozac [fluoxetine hcl]  Home Medications   Prior to Admission medications   Medication Sig Start Date End Date Taking? Authorizing Provider  escitalopram (LEXAPRO) 20 MG tablet Take 20 mg by mouth daily.   Yes [provider]  gabapentin (NEURONTIN) 600 MG tablet Take 600 mg by mouth 3 (three) times daily.   Yes [provider]  pantoprazole (PROTONIX) 40 MG tablet Take 40  mg by mouth daily.   Yes [provider]  prazosin (MINIPRESS) 5 MG capsule Take 12 mg by mouth at bedtime.   Yes [provider]  topiramate (TOPAMAX) 200 MG tablet Take 200 mg by mouth 2 (two) times daily.   Yes [provider]  traZODone (DESYREL) 100 MG tablet Take 100 mg by mouth at bedtime.   Yes [provider]  azithromycin (ZITHROMAX Z-PAK) 250 MG tablet 2 tabs po once day 1, then 1 tab po qd for next 4 days 10/15/16   Payton Mccallumonty, [place], MD  busPIRone (BUSPAR) 10 MG tablet Take 10 mg by mouth 3 (three) times daily.    [provider]  dicyclomine (BENTYL) 20 MG tablet Take 20 mg by mouth every 6 (six) hours.    [provider]  HYDROcodone-acetaminophen (NORCO/VICODIN) 5-325 MG tablet Take 1-2 tablets by mouth every 6 (six) hours as needed. 12/31/16   Lutricia Feiloemer, Dura Mccormack P, PA-C  naproxen (NAPROSYN) 500 MG tablet Take 1 tablet (500 mg total) by mouth 2 (two) times daily with a meal. 12/31/16   Lutricia Feiloemer, Sirus Labrie P, PA-C  predniSONE (DELTASONE) 20 MG tablet Take 1 tablet (20 mg total) by mouth daily. 10/15/16   Payton Mccallumonty, [place], MD  SUMAtriptan (IMITREX) 100 MG tablet Take 100 mg by mouth every 2 (two) hours as needed for migraine. May repeat in 2 hours if headache persists or recurs.    [provider]   Meds Ordered and  Administered this Visit   Medications  ketorolac (TORADOL) injection 60 mg (60 mg Intramuscular Given 12/31/16 1218)    BP 136/86 (BP Location: Right Arm)   Pulse 85   Temp 98.9 F (37.2 C) (Oral)   Resp 18   Ht 5\' 9"  (1.753 m)   Wt 190 lb (86.2 kg)   SpO2 99%   BMI 28.06 kg/m  No data found.   Physical Exam  Constitutional: She is oriented to person, place, and time. She appears well-developed and well-nourished. No distress.  HENT:  Head: Normocephalic and atraumatic.  Eyes: Pupils are equal, round, and reactive to light.  Neck: Normal range of motion.  Musculoskeletal:  Examination of the left nondominant  elbow shows ecchymosis and swelling about the elbow itself. She is tender posteriorly as well as over the olecranon. She has a limited range of motion that she can go through passively and actively. Pronation supination is intact with discomfort at the extremes of both. Strength distally is intact. She has normal sensation distally.  Neurological: She is alert and oriented to person, place, and time.  Skin: Skin is warm and dry. She is not diaphoretic.  Psychiatric: She has a normal mood and affect. Her behavior is normal. Judgment and thought content normal.  Nursing note and vitals reviewed.   Urgent Care Course     Procedures (including critical care time)  Labs Review Labs Reviewed - No data to display  Imaging Review Dg Elbow Complete Left  Result Date: 12/31/2016 CLINICAL DATA:  36 year old who fell while in her garden yesterday, injuring the left elbow when she tried to break the fall. Initial encounter. EXAM: LEFT ELBOW - COMPLETE 3+ VIEW COMPARISON:  None. FINDINGS: Elevation of the anterior and posterior fat pads, indicating a joint effusion and/or hemarthrosis. No visible acute fracture. Well preserved joint spaces. Well-preserved bone mineral density. IMPRESSION: No visible acute fracture. A joint effusion/hemarthrosis is present, so an occult fracture cannot be excluded but is not suspected. Electronically Signed   By: Hulan Saas M.D.   On: 12/31/2016 12:50     Visual Acuity Review  Right Eye Distance:   Left Eye Distance:   Bilateral Distance:    Right Eye Near:   Left Eye Near:    Bilateral Near:     Medications  ketorolac (TORADOL) injection 60 mg (60 mg Intramuscular Given 12/31/16 1218)      MDM   1. Contusion of left elbow, initial encounter    Discharge Medication List as of 12/31/2016  1:16 PM    START taking these medications   Details  naproxen (NAPROSYN) 500 MG tablet Take 1 tablet (500 mg total) by mouth 2 (two) times daily with a meal.,  Starting Fri 12/31/2016, Normal      Plan: 1. Test/x-ray results and diagnosis reviewed with patient 2. rx as per orders; risks, benefits, potential side effects reviewed with patient 3. Recommend supportive treatment with ice and elevation. Splint for active periods but needs to come out frequently for range of motion exercises to prevent stiffness. Reminded to also move fingers wrist and shoulder through full range . She is a disabled veteran and obtains most of her medical care through the [place]. I told her she continues to have problems she [facility]. She should wean the use of the sling and splint as she is able to tolerate more motion and comfort. 4. F/u prn if symptoms worsen or don't improve  Lutricia Feil, PA-C 12/31/16 1336

## 2016-12-31 NOTE — ED Triage Notes (Signed)
Patient fell yesterday while watering her yard injuring her left elbow. OTC medication do not resolved pain. No previous history of left elbow injury.

## 2017-01-12 ENCOUNTER — Ambulatory Visit (INDEPENDENT_AMBULATORY_CARE_PROVIDER_SITE_OTHER): Payer: Non-veteran care

## 2017-01-12 ENCOUNTER — Encounter: Payer: Self-pay | Admitting: *Deleted

## 2017-01-12 ENCOUNTER — Ambulatory Visit
Admission: EM | Admit: 2017-01-12 | Discharge: 2017-01-12 | Disposition: A | Discharge status: Home / Self Care | Payer: Non-veteran care | Attending: Family Medicine | Admitting: Family Medicine

## 2017-01-12 DIAGNOSIS — S92515A Nondisplaced fracture of proximal phalanx of left lesser toe(s), initial encounter for closed fracture: Secondary | ICD-10-CM

## 2017-01-12 MED ORDER — HYDROCODONE-ACETAMINOPHEN 5-325 MG PO TABS: 2.0000 | ORAL_TABLET | ORAL | 0 refills | Status: DC | PRN

## 2017-01-12 NOTE — ED Triage Notes (Signed)
Patient injured her left pinky toe last pm when she hit it on a door jam.

## 2017-01-12 NOTE — ED Provider Notes (Signed)
CSN: [id number]     Arrival date & time 01/12/17  [id number] History   First MD Initiated Contact with Patient 01/12/17 1044     Chief Complaint  Patient presents with  . Toe Injury   (Consider location/radiation/quality/duration/timing/severity/associated sxs/prior Treatment) HPI  This is a 36 year old female recently seen at this facility for a contusion/possible fracture of left elbow when she fell while watering plants in her father sharp. He states that last night she awoke to use the restroom and in walking to the restroom her left little toe on the door jam causing it to become displaced. The patient to reach down and put the toe back into position which was very painful. Since that time she's had a bruising and swelling mostly over the fifth toe.     Past Medical History:  Diagnosis Date  . Anxiety   . Depression   . GERD (gastroesophageal reflux disease)   . IBS (irritable bowel syndrome)   . Sleep disorder   . TIA (transient ischemic attack)    Past Surgical History:  Procedure Laterality Date  . ANKLE SURGERY Right   . CHOLECYSTECTOMY     History reviewed. No pertinent family history. Social History  Substance Use Topics  . Smoking status: Current Every Day Smoker    Packs/day: 1.00    Types: Cigarettes  . Smokeless tobacco: Never Used  . Alcohol use No   OB History    No data available     Review of Systems  Constitutional: Positive for activity change. Negative for chills, fatigue and fever.  Musculoskeletal: Positive for arthralgias and gait problem.  All other systems reviewed and are negative.   Allergies  Haldol [haloperidol lactate] and Prozac [fluoxetine hcl]  Home Medications   Prior to Admission medications   Medication Sig Start Date End Date Taking? Authorizing Provider  escitalopram (LEXAPRO) 20 MG tablet Take 20 mg by mouth daily.   Yes [provider]  gabapentin (NEURONTIN) 600 MG tablet Take 600 mg by mouth 3 (three) times daily.    Yes [provider]  naproxen (NAPROSYN) 500 MG tablet Take 1 tablet (500 mg total) by mouth 2 (two) times daily with a meal. 12/31/16  Yes Lutricia Feiloemer, Tristy Udovich P, PA-C  pantoprazole (PROTONIX) 40 MG tablet Take 40 mg by mouth daily.   Yes [provider]  prazosin (MINIPRESS) 5 MG capsule Take 12 mg by mouth at bedtime.   Yes [provider]  predniSONE (DELTASONE) 20 MG tablet Take 1 tablet (20 mg total) by mouth daily. 10/15/16  Yes Payton Mccallumonty, [place], MD  topiramate (TOPAMAX) 200 MG tablet Take 200 mg by mouth 2 (two) times daily.   Yes [provider]  traZODone (DESYREL) 100 MG tablet Take 100 mg by mouth at bedtime.   Yes [provider]  azithromycin (ZITHROMAX Z-PAK) 250 MG tablet 2 tabs po once day 1, then 1 tab po qd for next 4 days 10/15/16   Payton Mccallumonty, [place], MD  busPIRone (BUSPAR) 10 MG tablet Take 10 mg by mouth 3 (three) times daily.    [provider]  dicyclomine (BENTYL) 20 MG tablet Take 20 mg by mouth every 6 (six) hours.    [provider]  HYDROcodone-acetaminophen (NORCO/VICODIN) 5-325 MG tablet Take 2 tablets by mouth every 4 (four) hours as needed. 01/12/17   Lutricia Feiloemer, Macdonald Rigor P, PA-C  SUMAtriptan (IMITREX) 100 MG tablet Take 100 mg by mouth every 2 (two) hours as needed for migraine. May repeat in 2 hours  if headache persists or recurs.    [provider]   Meds Ordered and Administered this Visit  Medications - No data to display  BP 121/72 (BP Location: Right Arm)   Pulse 77   Temp 97 F (36.1 C) (Oral)   Resp 16   SpO2 97%  No data found.   Physical Exam  Constitutional: She is oriented to person, place, and time. She appears well-developed and well-nourished. No distress.  HENT:  Head: Normocephalic.  Eyes: Pupils are equal, round, and reactive to light.  Neck: Normal range of motion.  Musculoskeletal: She exhibits edema and tenderness. She exhibits no deformity.  Neurological: She is alert and oriented  to person, place, and time.  Skin: Skin is warm and dry. She is not diaphoretic.  Psychiatric: She has a normal mood and affect. Her behavior is normal. Judgment and thought content normal.  Nursing note and vitals reviewed.   Urgent Care Course     Procedures (including critical care time)  Labs Review Labs Reviewed - No data to display  Imaging Review Dg Foot Complete Left  Result Date: 01/12/2017 CLINICAL DATA:  Hit fifth toe on door.  Pain and bruising. EXAM: LEFT FOOT - COMPLETE 3+ VIEW COMPARISON:  None. FINDINGS: There is a fracture through the proximal phalanx of the left fifth toe. This is nondisplaced. No visible intra-articular extension. No subluxation or dislocation. IMPRESSION: Nondisplaced left fifth toe proximal phalangeal fracture. Electronically Signed   By: Charlett Nose M.D.   On: 01/12/2017 10:58     Visual Acuity Review  Right Eye Distance:   Left Eye Distance:   Bilateral Distance:    Right Eye Near:   Left Eye Near:    Bilateral Near:     Patients [place] substance abuse profile was examined. No red flags were seen. Her last prescription of hydrocodone was prescribed by myself May 25. This was for a possible contusion fracture of her left elbow. All other previous medications are issued through the [facility] in [place] from [email or web address]  Patient's left fifth toe was buddy taped to the fourth toe for support. She was placed into a postop shoe for better support. Patient was given a x-ray disc for her to take to the [facility] in [place] tomorrow.  MDM   1. Closed nondisplaced fracture of proximal phalanx of lesser toe of left foot, initial encounter    Discharge Medication List as of 01/12/2017 11:44 AM    Plan: 1. Test/x-ray results and diagnosis reviewed with patient 2. rx as per orders; risks, benefits, potential side effects reviewed with patient 3. Recommend supportive treatment with Elevation and ice. She will apply ice 20  minutes out of every 2 hours 4 times daily. Follow-up will be with the [facility] in [facility]. `She'll be seen for both her elbow and her toe fracture. 4. F/u prn if symptoms worsen or don't improve     Lutricia Feil, PA-C 01/12/17 1210

## 2017-04-18 ENCOUNTER — Encounter: Payer: Self-pay | Admitting: *Deleted

## 2017-04-18 ENCOUNTER — Ambulatory Visit (INDEPENDENT_AMBULATORY_CARE_PROVIDER_SITE_OTHER): Payer: Non-veteran care

## 2017-04-18 ENCOUNTER — Ambulatory Visit
Admission: EM | Admit: 2017-04-18 | Discharge: 2017-04-18 | Disposition: A | Discharge status: Home / Self Care | Payer: Non-veteran care | Attending: Family Medicine | Admitting: Family Medicine

## 2017-04-18 DIAGNOSIS — S5002XA Contusion of left elbow, initial encounter: Secondary | ICD-10-CM

## 2017-04-18 DIAGNOSIS — M79602 Pain in left arm: Secondary | ICD-10-CM | POA: Diagnosis not present

## 2017-04-18 DIAGNOSIS — S46812A Strain of other muscles, fascia and tendons at shoulder and upper arm level, left arm, initial encounter: Secondary | ICD-10-CM | POA: Diagnosis not present

## 2017-04-18 DIAGNOSIS — W19XXXA Unspecified fall, initial encounter: Secondary | ICD-10-CM

## 2017-04-18 MED ORDER — KETOROLAC TROMETHAMINE 60 MG/2ML IM SOLN
30.0000 mg | Freq: Once | INTRAMUSCULAR | Status: AC
  Administered 2017-04-18: 30 mg via INTRAMUSCULAR

## 2017-04-18 MED ORDER — TRAMADOL HCL 50 MG PO TABS: 50.0000 mg | ORAL_TABLET | Freq: Three times a day (TID) | ORAL | 0 refills | Status: DC | PRN

## 2017-04-18 NOTE — Discharge Instructions (Signed)
Take medication as prescribed. Rest. Ice. Use sling as needed. Stretch.   Follow up with your orthopedic as discussed.   Follow up with your primary care physician this week as needed. Return to Urgent care for new or worsening concerns.

## 2017-04-18 NOTE — ED Triage Notes (Signed)
Pt tripped and fell, landed on left arm. Now c/o left wrist, elbow, and shoulder pain.

## 2017-04-18 NOTE — ED Provider Notes (Signed)
[facility] ____________________________________________  Time seen: Approximately 2:52 PM  I have reviewed the triage vital signs and the nursing notes.   HISTORY  Chief Complaint Arm Injury   HPI Kylie Lam is a 36 y.o. female presenting for evaluation of left elbow pain after injury that occurred today. Patient states that she has "weak ankles "and she often rolls her ankles which sometimes leads her to falling. Patient states that she rolled her ankles twice a day bleeding to fall directly on left arm both times. States from the first fall she didn't have much pain, but the second she landed directly on the left arm causing pain. States she has pain directly in the elbow with decreased range of motion and also some feeling pain in left shoulder from fall. States that she fell only because of her ankles. Denies any syncope, near syncope or other contributing components to the fall. Reports she is right-hand dominant. States the rest of her left arm felt fine prior to the fall. Reports that she was seen in urgent care in May for similar issues with the left elbow was found by an MRI at the [place] that she did have a fracture. Patient states that she has not been in a cast and has not yet seen orthopedic, but "waiting for her appointment ".   Denies head injury, loss of consciousness, chest pain, shortness of breath, paresthesias, abdominal pain, other actually pain or injury. States no pain to lower extremities or ankles. Reports otherwise feels well. States pain is a lot to left elbow. No over-the-counter medications taken prior to arrival. Denies cardiac history. Denies renal insufficiency. Reports total hysterectomy. Denies recent sickness. Denies recent antibiotic use.   [facility]   Past Medical History:  Diagnosis Date  . Anxiety   . Depression   . GERD (gastroesophageal reflux disease)   . IBS (irritable bowel syndrome)   . Sleep disorder   . TIA (transient ischemic  attack)     There are no active problems to display for this patient.   Past Surgical History:  Procedure Laterality Date  . ANKLE SURGERY Right   . CHOLECYSTECTOMY       No current facility-administered medications for this encounter.   Current Outpatient Prescriptions:  .  busPIRone (BUSPAR) 10 MG tablet, Take 10 mg by mouth 3 (three) times daily., Disp: , Rfl:  .  dicyclomine (BENTYL) 20 MG tablet, Take 20 mg by mouth every 6 (six) hours., Disp: , Rfl:  .  escitalopram (LEXAPRO) 20 MG tablet, Take 20 mg by mouth daily., Disp: , Rfl:  .  gabapentin (NEURONTIN) 600 MG tablet, Take 600 mg by mouth 3 (three) times daily., Disp: , Rfl:  .  azithromycin (ZITHROMAX Z-PAK) 250 MG tablet, 2 tabs po once day 1, then 1 tab po qd for next 4 days, Disp: 6 each, Rfl: 0 .  HYDROcodone-acetaminophen (NORCO/VICODIN) 5-325 MG tablet, Take 2 tablets by mouth every 4 (four) hours as needed., Disp: 10 tablet, Rfl: 0 .  naproxen (NAPROSYN) 500 MG tablet, Take 1 tablet (500 mg total) by mouth 2 (two) times daily with a meal., Disp: 60 tablet, Rfl: 0 .  pantoprazole (PROTONIX) 40 MG tablet, Take 40 mg by mouth daily., Disp: , Rfl:  .  prazosin (MINIPRESS) 5 MG capsule, Take 12 mg by mouth at bedtime., Disp: , Rfl:  .  predniSONE (DELTASONE) 20 MG tablet, Take 1 tablet (20 mg total) by mouth daily., Disp: 7 tablet, Rfl: 0 .  SUMAtriptan (IMITREX) 100 MG tablet, Take 100 mg by mouth every 2 (two) hours as needed for migraine. May repeat in 2 hours if headache persists or recurs., Disp: , Rfl:  .  topiramate (TOPAMAX) 200 MG tablet, Take 200 mg by mouth 2 (two) times daily., Disp: , Rfl:  .  traMADol (ULTRAM) 50 MG tablet, Take 1 tablet (50 mg total) by mouth every 8 (eight) hours as needed (Do not drive or operate machinery while taking as can cause drowsiness.)., Disp: 8 tablet, Rfl: 0 .  traZODone (DESYREL) 100 MG tablet, Take 100 mg by mouth at bedtime., Disp: , Rfl:   Allergies Haldol [haloperidol  lactate] and Prozac [fluoxetine hcl]  History reviewed. No pertinent family history.  Social History Social History  Substance Use Topics  . Smoking status: Current Every Day Smoker    Packs/day: 1.00    Types: Cigarettes  . Smokeless tobacco: Never Used  . Alcohol use No    Review of Systems Constitutional: No fever/chills Cardiovascular: Denies chest pain. Respiratory: Denies shortness of breath. Gastrointestinal: No abdominal pain.  No nausea, no vomiting Genitourinary: Negative for dysuria. Musculoskeletal: Negative for back pain. AS above.  Skin: Negative for rash.   ____________________________________________   PHYSICAL EXAM:  VITAL SIGNS: ED Triage Vitals  Enc Vitals Group     BP 04/18/17 1411 130/88     Pulse Rate 04/18/17 1411 78     Resp 04/18/17 1411 16     Temp 04/18/17 1411 98.7 F (37.1 C)     Temp Source 04/18/17 1411 Oral     SpO2 04/18/17 1411 98 %     Weight 04/18/17 1413 180 lb (81.6 kg)     Height 04/18/17 1413 5\' 9"  (1.753 m)     Head Circumference --      Peak Flow --      Pain Score 04/18/17 1414 8     Pain Loc --      Pain Edu? --      Excl. in GC? --     Constitutional: Alert and oriented. Well appearing and in no acute distress. ENT      Head: Normocephalic and atraumatic. Cardiovascular: Normal rate, regular rhythm. Grossly normal heart sounds.  Good peripheral circulation. Respiratory: Normal respiratory effort without tachypnea nor retractions. Breath sounds are clear and equal bilaterally. No wheezes, rales, rhonchi. Gastrointestinal: Soft and nontender. Musculoskeletal:  Steady gait. No midline cervical, thoracic or lumbar tenderness to palpation. Except: Left lateral and posterior elbow mild to moderate tenderness to direct palpation, no swelling, no ecchymosis, pain with extension and flexion, slightly limited flexion, full pronation and supination with mild pain, no erythema. Left wrist and left shoulder minimal diffuse  tenderness to palpation, no point bony tenderness, and full range of motion present. Left trapezius mild tenderness to palpation, and no point cervical tenderness to palpation. Left distal hand with normal distal sensation and capillary refill, no motor or tendon deficits to left hand noted. Bilateral distal radial pulses equal and easily palpated. Neurologic:  Normal speech and language. Speech is normal. No gait instability.  Skin:  Skin is warm, dry.  Psychiatric: Mood and affect are normal. Speech and behavior are normal. Patient exhibits appropriate insight and judgment   ___________________________________________   LABS (all labs ordered are listed, but only abnormal results are displayed)  Labs Reviewed - No data to display ____________________________________________  RADIOLOGY  Dg Elbow Complete Left  Result Date: 04/18/2017 CLINICAL DATA:  Recent elbow fracture.  Fall today.  EXAM: LEFT ELBOW - COMPLETE 3+ VIEW COMPARISON:  12/31/2016 FINDINGS: There is no joint effusion. No acute bony abnormality. Specifically, no fracture, subluxation, or dislocation. Soft tissues are intact. IMPRESSION: No acute bony abnormality. Electronically Signed   By: Charlett Nose M.D.   On: 04/18/2017 15:21   ____________________________________________   PROCEDURES Procedures   INITIAL IMPRESSION / ASSESSMENT AND PLAN / ED COURSE  Pertinent labs & imaging results that were available during my care of the patient were reviewed by me and considered in my medical decision making (see chart for details).  Well-appearing patient. No acute distress. Presents for evaluation of left elbow pain post mechanical injury. Patient reports recent elbow fracture. Left elbow x-ray today negative per radiologist and reviewed by myself. Patient declined other imaging, including image of wrist and shoulder. Also with trapezius muscle tenderness noted. Suspect contusion and sprain injuries. Sling applied. Encouraged  rest, ice, elevation and range of motion exercises. Encouraged close orthopedic follow-up and encouraged patient to call.  Patient reports she is having a lot of pain with this. [place] controlled substance database reviewed and patient receiving regular clonazepam, however with also multiple other pain medication prescriptions noted in the last few months. most recent 03/21/2017 tylenol 3 quantity 8 and 03/07/2017 hydrocodone quantity 20. discussed with patient encouraged supportive care, quantity 8 tramadol given for as needed breakthrough for acute pain.Discussed indication, risks and benefits of medications with patient.  Discussed follow up with Primary care physician this week. Discussed follow up and return parameters including no resolution or any worsening concerns. Patient verbalized understanding and agreed to plan.   ____________________________________________   FINAL CLINICAL IMPRESSION(S) / ED DIAGNOSES  Final diagnoses:  Contusion of left elbow, initial encounter  Left arm pain  Strain of left trapezius muscle, initial encounter     Discharge Medication List as of 04/18/2017  3:42 PM    START taking these medications   Details  traMADol (ULTRAM) 50 MG tablet Take 1 tablet (50 mg total) by mouth every 8 (eight) hours as needed (Do not drive or operate machinery while taking as can cause drowsiness.)., Starting Mon 04/18/2017, Print        Note: This dictation was prepared with Dragon dictation along with smaller phrase technology. Any transcriptional errors that result from this process are unintentional.         Renford Dills, NP 04/18/17 1659

## 2017-05-07 ENCOUNTER — Ambulatory Visit
Admission: EM | Admit: 2017-05-07 | Discharge: 2017-05-07 | Disposition: A | Discharge status: Home / Self Care | Payer: Non-veteran care | Attending: Family Medicine | Admitting: Family Medicine

## 2017-05-07 ENCOUNTER — Ambulatory Visit (INDEPENDENT_AMBULATORY_CARE_PROVIDER_SITE_OTHER): Payer: Non-veteran care

## 2017-05-07 DIAGNOSIS — S93601A Unspecified sprain of right foot, initial encounter: Secondary | ICD-10-CM

## 2017-05-07 DIAGNOSIS — M79671 Pain in right foot: Secondary | ICD-10-CM

## 2017-05-07 DIAGNOSIS — N390 Urinary tract infection, site not specified: Secondary | ICD-10-CM

## 2017-05-07 LAB — URINALYSIS, COMPLETE (UACMP) WITH MICROSCOPIC
BACTERIA UA: NONE SEEN
BILIRUBIN URINE: NEGATIVE
GLUCOSE, UA: NEGATIVE mg/dL
Hgb urine dipstick: NEGATIVE
KETONES UR: NEGATIVE mg/dL
LEUKOCYTES UA: NEGATIVE
Nitrite: NEGATIVE
PH: 7 (ref 5.0–8.0)
Protein, ur: NEGATIVE mg/dL
RBC / HPF: NONE SEEN RBC/hpf (ref 0–5)
Specific Gravity, Urine: 1.015 (ref 1.005–1.030)
WBC, UA: NONE SEEN WBC/hpf (ref 0–5)

## 2017-05-07 MED ORDER — KETOROLAC TROMETHAMINE 60 MG/2ML IM SOLN
60.0000 mg | Freq: Once | INTRAMUSCULAR | Status: AC
  Administered 2017-05-07: 60 mg via INTRAMUSCULAR

## 2017-05-07 MED ORDER — MELOXICAM 7.5 MG PO TABS: 7.5000 mg | ORAL_TABLET | Freq: Two times a day (BID) | ORAL | 0 refills | Status: DC | PRN

## 2017-05-07 MED ORDER — HYDROCODONE-ACETAMINOPHEN 5-325 MG PO TABS: 1.0000 | ORAL_TABLET | Freq: Four times a day (QID) | ORAL | 0 refills | Status: DC | PRN

## 2017-05-07 MED ORDER — SULFAMETHOXAZOLE-TRIMETHOPRIM 800-160 MG PO TABS: 1.0000 | ORAL_TABLET | Freq: Two times a day (BID) | ORAL | 0 refills | Status: AC

## 2017-05-07 NOTE — ED Triage Notes (Addendum)
Right side foot pain kick the board in sleep really hard and thinks she broke the foot onset couple of days but now can't put pressure, bruce,  last 3 fingers are with numbness and pain and urinary infection symptoms pain on back and right side and onset week and half.

## 2017-05-07 NOTE — ED Provider Notes (Signed)
[facility]    CSN: [id number] Arrival date & time: 05/07/17  1407     History   Chief Complaint Chief Complaint  Patient presents with  . Foot Pain  . Urinary Tract Infection    HPI Kylie Lam is a 36 y.o. female.   Patient is a 36 year old female who presents complaining of right foot pain as well as kidney infection symptoms. Patient reports that she kicked the foot board of her bed fairly hard a couple of nights ago. She initially thought it was this time a bruise and that I would get better but reports that her pain is currently 8/10 and that she has also had some numbness/tingling in the last 3 visits and can't bend them down. Patient reports the numbness and tingling started yesterday. Patient also reports bilateral lower abdominal pain, frequency, and pain with urination. Patient reports shooting/stabbing bladder pain that occurs throughout the urination process. Patient reports she takes about 8 bottles of water per day. Patient reports that she has had a couple of UTIs per year.       Past Medical History:  Diagnosis Date  . Anxiety   . Depression   . GERD (gastroesophageal reflux disease)   . IBS (irritable bowel syndrome)   . Sleep disorder   . TIA (transient ischemic attack)     There are no active problems to display for this patient.   Past Surgical History:  Procedure Laterality Date  . ANKLE SURGERY Right   . CHOLECYSTECTOMY      OB History    No data available       Home Medications    Prior to Admission medications   Medication Sig Start Date End Date Taking? Authorizing Provider  busPIRone (BUSPAR) 10 MG tablet Take 10 mg by mouth 3 (three) times daily.   Yes [provider]  dicyclomine (BENTYL) 20 MG tablet Take 20 mg by mouth every 6 (six) hours.   Yes [provider]  escitalopram (LEXAPRO) 20 MG tablet Take 20 mg by mouth daily.   Yes [provider]  gabapentin (NEURONTIN) 600 MG tablet Take  600 mg by mouth 3 (three) times daily.   Yes [provider]  naproxen (NAPROSYN) 500 MG tablet Take 1 tablet (500 mg total) by mouth 2 (two) times daily with a meal. 12/31/16  Yes Lutricia Feil, PA-C  pantoprazole (PROTONIX) 40 MG tablet Take 40 mg by mouth daily.   Yes [provider]  prazosin (MINIPRESS) 5 MG capsule Take 12 mg by mouth at bedtime.   Yes [provider]  SUMAtriptan (IMITREX) 100 MG tablet Take 100 mg by mouth every 2 (two) hours as needed for migraine. May repeat in 2 hours if headache persists or recurs.   Yes [provider]  topiramate (TOPAMAX) 200 MG tablet Take 200 mg by mouth 2 (two) times daily.   Yes [provider]  traZODone (DESYREL) 100 MG tablet Take 100 mg by mouth at bedtime.   Yes [provider]  azithromycin (ZITHROMAX Z-PAK) 250 MG tablet 2 tabs po once day 1, then 1 tab po qd for next 4 days 10/15/16   Payton Mccallum, MD  HYDROcodone-acetaminophen (NORCO) 5-325 MG tablet Take 1 tablet by mouth every 6 (six) hours as needed for moderate pain. 05/07/17   Candis Schatz, PA-C  HYDROcodone-acetaminophen (NORCO/VICODIN) 5-325 MG tablet Take 2 tablets by mouth every 4 (four) hours as needed. 01/12/17   Lutricia Feil, PA-C  meloxicam (MOBIC) 7.5 MG tablet Take 1 tablet (7.5 mg total) by mouth every 12 (twelve) hours as needed for pain. 05/07/17   Candis Schatz, PA-C  predniSONE (DELTASONE) 20 MG tablet Take 1 tablet (20 mg total) by mouth daily. 10/15/16   Payton Mccallum, MD  sulfamethoxazole-trimethoprim (BACTRIM DS,SEPTRA DS) 800-160 MG tablet Take 1 tablet by mouth 2 (two) times daily. 05/07/17 05/14/17  Candis Schatz, PA-C  traMADol (ULTRAM) 50 MG tablet Take 1 tablet (50 mg total) by mouth every 8 (eight) hours as needed (Do not drive or operate machinery while taking as can cause drowsiness.). 04/18/17   Renford Dills, NP    Family History No family history on file.  Social History Social  History  Substance Use Topics  . Smoking status: Current Every Day Smoker    Packs/day: 1.00    Types: Cigarettes  . Smokeless tobacco: Current User  . Alcohol use No     Allergies   Haldol [haloperidol lactate] and Prozac [fluoxetine hcl]   Review of Systems Review of Systems  As noted above in history of present illness, other systems reviewed and found to be negative.   Physical Exam Triage Vital Signs ED Triage Vitals  Enc Vitals Group     BP 05/07/17 1451 106/68     Pulse Rate 05/07/17 1451 80     Resp 05/07/17 1451 16     Temp 05/07/17 1451 98.1 F (36.7 C)     Temp Source 05/07/17 1451 Oral     SpO2 05/07/17 1451 98 %     Weight 05/07/17 1453 185 lb (83.9 kg)     Height 05/07/17 1453  (1.753 m)     Head Circumference --      Peak Flow --      Pain Score --      Pain Loc --      Pain Edu? --      Excl. in GC? --    No data found.   Updated Vital Signs BP 106/68 (BP Location: Left Arm)   Pulse 80   Temp 98.1 F (36.7 C) (Oral)   Resp 16   Ht  (1.753 m)   Wt 185 lb (83.9 kg)   SpO2 98%   BMI 27.32 kg/m   Physical Exam  Constitutional: She is oriented to person, place, and time. She appears well-developed and well-nourished. No distress.  HENT:  Head: Normocephalic and atraumatic.  Eyes: EOM are normal.  Cardiovascular: Normal rate.   Pulmonary/Chest: Effort normal. No respiratory distress.  Abdominal: Soft. She exhibits no distension.  + bilateral CVA tenderness. No lower abdominal tenderness  Musculoskeletal:       Right foot: There is tenderness and bony tenderness.       Feet:  Neurological: She is alert and oriented to person, place, and time. No cranial nerve deficit.     UC Treatments / Results  Labs (all labs ordered are listed, but only abnormal results are displayed) Labs Reviewed  URINALYSIS, COMPLETE (UACMP) WITH MICROSCOPIC - Abnormal; Notable for the following:       Result Value   Color, Urine STRAW (*)     Squamous Epithelial / LPF 0-5 (*)    All other components within normal limits  URINE CULTURE    EKG  EKG Interpretation None       Radiology Dg Foot Complete Right  Result Date: 05/07/2017 CLINICAL DATA:  Distal right foot pain after kicking the foot board of  her bed. EXAM: RIGHT FOOT COMPLETE - 3+ VIEW COMPARISON:  Right ankle dated 03/02/2016. FINDINGS: Again demonstrated is right ankle fixation hardware. No fracture or dislocation seen. IMPRESSION: No fracture. Electronically Signed   By: Beckie Salts M.D.   On: 05/07/2017 16:30    Procedures Procedures (including critical care time)  Medications Ordered in UC Medications  ketorolac (TORADOL) injection 60 mg (60 mg Intramuscular Given 05/07/17 1542)     Initial Impression / Assessment and Plan / UC Course  I have reviewed the triage vital signs and the nursing notes.  Pertinent labs & imaging results that were available during my care of the patient were reviewed by me and considered in my medical decision making (see chart for details).    Right foot pain after kicking foot border couple nights ago. Painful to minimal palpation. Will check x-ray. Patient with bilateral CVA tenderness and burning sensation throughout urination. UA without bacteria, leukocyte esterase, or nitrites. We'll send for culture. Thank you Final Clinical Impressions(s) / UC Diagnoses   Final diagnoses:  Urinary tract infection without hematuria, site unspecified  Right foot pain  Sprain of right foot, initial encounter    New Prescriptions Discharge Medication List as of 05/07/2017  4:43 PM    START taking these medications   Details  !! HYDROcodone-acetaminophen (NORCO) 5-325 MG tablet Take 1 tablet by mouth every 6 (six) hours as needed for moderate pain., Starting Sat 05/07/2017, Print    meloxicam (MOBIC) 7.5 MG tablet Take 1 tablet (7.5 mg total) by mouth every 12 (twelve) hours as needed for pain., Starting Sat 05/07/2017, Normal      sulfamethoxazole-trimethoprim (BACTRIM DS,SEPTRA DS) 800-160 MG tablet Take 1 tablet by mouth 2 (two) times daily., Starting Sat 05/07/2017, Until Sat 05/14/2017, Normal     !! - Potential duplicate medications found. Please discuss with provider.     X-ray without acute fracture. Likely a good foot sprain or contusion. Will put patient in walking boot for comfort. Also give her prescriptions for Bactrim for her probably UTI based on complaints and exam. Also give her prescription for Mobic 7.5 mg twice daily as needed and Norco for breakthrough pain. Controlled substance surgery was checked. Patient received Norco 5/32520 on July 30 and Tylenol 3, #8 on August 13. Patient also got Klonopin, #15, on August 21 and September 7. Advised patient to wear the boot until symptoms improve. Patient continues to have issues with have her follow-up with her orthopedist the [place].  Controlled Substance Prescriptions [place] Controlled Substance Registry consulted? Yes, I have consulted the [place] Controlled Substances Registry for this patient, and feel the risk/benefit ratio today is favorable for proceeding with this prescription for a controlled substance.  Candis Schatz, PA-C    Candis Schatz, PA-C 05/07/17 1722

## 2017-05-07 NOTE — Discharge Instructions (Signed)
-  Bactrim: one tablet twice a day until gone -push fluids -walking boot -exercises as above -rest, ice, elevation as attached -Mobic: one tablet q 12 hours as needed for pain -Norco: one tablet every 6 hours as needed for pain -follow up with PCP or ortho if no improvement in 5-7 days

## 2017-05-08 LAB — URINE CULTURE

## 2017-07-03 ENCOUNTER — Ambulatory Visit
Admission: EM | Admit: 2017-07-03 | Discharge: 2017-07-03 | Disposition: A | Discharge status: Home / Self Care | Payer: Non-veteran care | Attending: Emergency Medicine | Admitting: Emergency Medicine

## 2017-07-03 ENCOUNTER — Ambulatory Visit (INDEPENDENT_AMBULATORY_CARE_PROVIDER_SITE_OTHER): Payer: Non-veteran care

## 2017-07-03 DIAGNOSIS — R05 Cough: Secondary | ICD-10-CM | POA: Diagnosis not present

## 2017-07-03 DIAGNOSIS — R062 Wheezing: Secondary | ICD-10-CM | POA: Diagnosis not present

## 2017-07-03 DIAGNOSIS — J209 Acute bronchitis, unspecified: Secondary | ICD-10-CM

## 2017-07-03 DIAGNOSIS — R0602 Shortness of breath: Secondary | ICD-10-CM | POA: Diagnosis not present

## 2017-07-03 DIAGNOSIS — J45901 Unspecified asthma with (acute) exacerbation: Secondary | ICD-10-CM

## 2017-07-03 DIAGNOSIS — R509 Fever, unspecified: Secondary | ICD-10-CM | POA: Diagnosis not present

## 2017-07-03 LAB — CBC WITH DIFFERENTIAL/PLATELET
BASOS ABS: 0.1 10*3/uL (ref 0–0.1)
BASOS PCT: 1 %
EOS ABS: 0.1 10*3/uL (ref 0–0.7)
EOS PCT: 1 %
HCT: 42 % (ref 35.0–47.0)
Hemoglobin: 14.5 g/dL (ref 12.0–16.0)
LYMPHS ABS: 2 10*3/uL (ref 1.0–3.6)
LYMPHS PCT: 28 %
MCH: 32.2 pg (ref 26.0–34.0)
MCHC: 34.6 g/dL (ref 32.0–36.0)
MCV: 93.1 fL (ref 80.0–100.0)
MONO ABS: 0.3 10*3/uL (ref 0.2–0.9)
MONOS PCT: 5 %
Neutro Abs: 4.6 10*3/uL (ref 1.4–6.5)
Neutrophils Relative %: 65 %
PLATELETS: 225 10*3/uL (ref 150–440)
RBC: 4.51 MIL/uL (ref 3.80–5.20)
RDW: 13.4 % (ref 11.5–14.5)
WBC: 7.1 10*3/uL (ref 3.6–11.0)

## 2017-07-03 LAB — COMPREHENSIVE METABOLIC PANEL
ALBUMIN: 4.6 g/dL (ref 3.5–5.0)
ALT: 23 U/L (ref 14–54)
AST: 28 U/L (ref 15–41)
Alkaline Phosphatase: 100 U/L (ref 38–126)
Anion gap: 11 (ref 5–15)
BUN: 13 mg/dL (ref 6–20)
CHLORIDE: 103 mmol/L (ref 101–111)
CO2: 23 mmol/L (ref 22–32)
CREATININE: 0.99 mg/dL (ref 0.44–1.00)
Calcium: 9.3 mg/dL (ref 8.9–10.3)
GFR calc Af Amer: >60 mL/min (ref >60)
GFR calc non Af Amer: >60 mL/min (ref >60)
GLUCOSE: 107 mg/dL — AB (ref 65–99)
POTASSIUM: 3.8 mmol/L (ref 3.5–5.1)
Sodium: 137 mmol/L (ref 135–145)
Total Bilirubin: 0.2 mg/dL — ABNORMAL LOW (ref 0.3–1.2)
Total Protein: 8.3 g/dL — ABNORMAL HIGH (ref 6.5–8.1)

## 2017-07-03 LAB — RAPID INFLUENZA A&B ANTIGENS (ARMC ONLY)
INFLUENZA A (ARMC): NEGATIVE
INFLUENZA B (ARMC): NEGATIVE

## 2017-07-03 MED ORDER — AEROCHAMBER PLUS MISC: 2 refills | Status: DC

## 2017-07-03 MED ORDER — PREDNISONE 50 MG PO TABS: 60.0000 mg | ORAL_TABLET | Freq: Once | ORAL | Status: DC

## 2017-07-03 MED ORDER — HYDROCOD POLST-CPM POLST ER 10-8 MG/5ML PO SUER: 5.0000 mL | Freq: Two times a day (BID) | ORAL | 0 refills | Status: DC | PRN

## 2017-07-03 MED ORDER — BENZONATATE 200 MG PO CAPS: 200.0000 mg | ORAL_CAPSULE | Freq: Three times a day (TID) | ORAL | 0 refills | Status: DC | PRN

## 2017-07-03 MED ORDER — IPRATROPIUM-ALBUTEROL 0.5-2.5 (3) MG/3ML IN SOLN
3.0000 mL | Freq: Once | RESPIRATORY_TRACT | Status: AC
Start: 2017-07-03 — End: 2017-07-03
  Administered 2017-07-03: 3 mL via RESPIRATORY_TRACT

## 2017-07-03 MED ORDER — IBUPROFEN 600 MG PO TABS: 600.0000 mg | ORAL_TABLET | Freq: Four times a day (QID) | ORAL | 0 refills | Status: DC | PRN

## 2017-07-03 MED ORDER — ALBUTEROL SULFATE HFA 108 (90 BASE) MCG/ACT IN AERS: 1.0000 | INHALATION_SPRAY | Freq: Four times a day (QID) | RESPIRATORY_TRACT | 0 refills | Status: AC | PRN

## 2017-07-03 MED ORDER — AZITHROMYCIN 500 MG PO TABS: 500.0000 mg | ORAL_TABLET | Freq: Every day | ORAL | 0 refills | Status: AC

## 2017-07-03 MED ORDER — PREDNISONE 50 MG PO TABS
60.0000 mg | ORAL_TABLET | Freq: Once | ORAL | Status: AC
  Administered 2017-07-03: 12:00:00 60 mg via ORAL

## 2017-07-03 NOTE — Discharge Instructions (Signed)
discontinue the Cipro and start the azithromycin 500 mg for 5 days.  Also, Tussionex cough syrup at night, Tessalon cough pills for the day, 2 puffs every 4-6 hours from your albuterol inhaler with a spacer.    Prednisone 40 mg for 5 days, steroids tomorrow.  You have already taken today's dose.  Ibuprofen 600 mg to take with 1 g of Tylenol 3-4 times a day. [facility] in [facility] days,

## 2017-07-03 NOTE — ED Provider Notes (Signed)
HPI  SUBJECTIVE:  Kylie Lam is a 36 y.o. female who presents with fevers T-max 102, abdominal and chest soreness, wheezing, shortness of breath with exertion to 100 feet and shortness of breath that is gradually getting worse for for 5 days. Has had these sx for 2 weeks.  She is on day #6 Cipro for bronchitis.  She has been taking DayQuil, Cipro, Tylenol.  She does not have an albuterol inhaler at home.  No alleviating factors.  Symptoms are worse when she is "hot".  She does report postnasal drip, nasal congestion, rhinorrhea.  No unintentional weight gain, PND, orthopnea, nocturia.  States that she cannot sleep at night secondary to the cough.  She denies pleuritic pain, lower extremity edema, calf pain, hemoptysis, surgery in the past 4 weeks, immobilization.  No exogenous estrogen.  She has a past medical history of anxiety/panic attacks, asthma, pneumonia for which she was admitted to the ICU, PTSD.  No history of PE, DVT, cancer, diabetes, hypertension, CHF. Pt got flu shot this year .  LMP: Status post hysterectomy.  [facility]   Past Medical History:  Diagnosis Date  . Anxiety   . Depression   . GERD (gastroesophageal reflux disease)   . IBS (irritable bowel syndrome)   . Sleep disorder   . TIA (transient ischemic attack)     Past Surgical History:  Procedure Laterality Date  . ANKLE SURGERY Right   . CHOLECYSTECTOMY      No family history on file.  Social History   Tobacco Use  . Smoking status: Current Every Day Smoker    Packs/day: 1.00    Types: Cigarettes  . Smokeless tobacco: Current User  Substance Use Topics  . Alcohol use: No  . Drug use: No    No current facility-administered medications for this encounter.   Current Outpatient Medications:  .  albuterol (PROVENTIL HFA;VENTOLIN HFA) 108 (90 Base) MCG/ACT inhaler, Inhale 1-2 puffs into the lungs every 6 (six) hours as needed for wheezing or shortness of breath., Disp: 1 Inhaler, Rfl: 0 .  azithromycin  (ZITHROMAX) 500 MG tablet, Take 1 tablet (500 mg total) by mouth daily for 5 days., Disp: 5 tablet, Rfl: 0 .  benzonatate (TESSALON) 200 MG capsule, Take 1 capsule (200 mg total) by mouth 3 (three) times daily as needed for cough., Disp: 30 capsule, Rfl: 0 .  busPIRone (BUSPAR) 10 MG tablet, Take 10 mg by mouth 3 (three) times daily., Disp: , Rfl:  .  chlorpheniramine-HYDROcodone (TUSSIONEX PENNKINETIC ER) 10-8 MG/5ML SUER, Take 5 mLs by mouth every 12 (twelve) hours as needed for cough., Disp: 120 mL, Rfl: 0 .  dicyclomine (BENTYL) 20 MG tablet, Take 20 mg by mouth every 6 (six) hours., Disp: , Rfl:  .  escitalopram (LEXAPRO) 20 MG tablet, Take 20 mg by mouth daily., Disp: , Rfl:  .  gabapentin (NEURONTIN) 600 MG tablet, Take 600 mg by mouth 3 (three) times daily., Disp: , Rfl:  .  ibuprofen (ADVIL,MOTRIN) 600 MG tablet, Take 1 tablet (600 mg total) by mouth every 6 (six) hours as needed., Disp: 30 tablet, Rfl: 0 .  pantoprazole (PROTONIX) 40 MG tablet, Take 40 mg by mouth daily., Disp: , Rfl:  .  prazosin (MINIPRESS) 5 MG capsule, Take 12 mg by mouth at bedtime., Disp: , Rfl:  .  predniSONE (DELTASONE) 20 MG tablet, Take 1 tablet (20 mg total) by mouth daily., Disp: 7 tablet, Rfl: 0 .  Spacer/Aero-Holding Chambers (AEROCHAMBER PLUS) inhaler, Use as  instructed, Disp: 1 each, Rfl: 2 .  SUMAtriptan (IMITREX) 100 MG tablet, Take 100 mg by mouth every 2 (two) hours as needed for migraine. May repeat in 2 hours if headache persists or recurs., Disp: , Rfl:  .  topiramate (TOPAMAX) 200 MG tablet, Take 200 mg by mouth 2 (two) times daily., Disp: , Rfl:  .  traZODone (DESYREL) 100 MG tablet, Take 100 mg by mouth at bedtime., Disp: , Rfl:   Allergies  Allergen Reactions  . Haldol [Haloperidol Lactate] Other (See Comments)    Stiffness  . Prozac [Fluoxetine Hcl]     Redness all over body.     ROS  As noted in HPI.   Physical Exam  BP (!) (P) 154/131 (BP Location: Left Arm)   Pulse 84   Resp  16   SpO2 99%   Constitutional: Well developed, well nourished, anxious, hyperventilating, coughing Eyes: PERRL, EOMI, conjunctiva normal bilaterally HENT: Normocephalic, atraumatic,mucus membranes moist Respiratory: Poor respiratory effort, fair air movement, clear to auscultation bilaterally, no rales, no wheezing, no rhonchi positive diffuse chest wall tenderness Cardiovascular: Normal rate and rhythm, no murmurs, no gallops, no rubs GI: Nondistended skin: No rash, skin intact Musculoskeletal: Calves symmetric, no edema, no tenderness, no deformities Neurologic: Alert & oriented x 3, CN II-XII grossly intact, no motor deficits, sensation grossly intact Psychiatric: Speech and behavior appropriate   ED Course   Medications  ipratropium-albuterol (DUONEB) 0.5-2.5 (3) MG/3ML nebulizer solution 3 mL (3 mLs Nebulization Given 07/03/17 1052)  predniSONE (DELTASONE) tablet 60 mg (60 mg Oral Given 07/03/17 1146)    Orders Placed This Encounter  Procedures  . Rapid Influenza A&B Antigens ([facility] only)    Standing Status:   Standing    Number of Occurrences:   1  . DG Chest 2 View    Standing Status:   Standing    Number of Occurrences:   1    Order Specific Question:   Reason for Exam (SYMPTOM  OR DIAGNOSIS REQUIRED)    Answer:   r/o PNA effusion, edema  . Comprehensive metabolic panel    Standing Status:   Standing    Number of Occurrences:   1  . CBC with Differential    Standing Status:   Standing    Number of Occurrences:   1  . Droplet precaution    Standing Status:   Standing    Number of Occurrences:   1   Results for orders placed or performed during the hospital encounter of 07/03/17 (from the past 24 hour(s))  Rapid Influenza A&B Antigens ([facility] only)     Status: None   Collection Time: 07/03/17 10:35 AM  Result Value Ref Range   Influenza A ([facility]) NEGATIVE NEGATIVE   Influenza B ([facility]) NEGATIVE NEGATIVE  Comprehensive metabolic panel     Status: Abnormal    Collection Time: 07/03/17 10:35 AM  Result Value Ref Range   Sodium 137 135 - 145 mmol/L   Potassium 3.8 3.5 - 5.1 mmol/L   Chloride 103 101 - 111 mmol/L   CO2 23 22 - 32 mmol/L   Glucose, Bld 107 (H) 65 - 99 mg/dL   BUN 13 6 - 20 mg/dL   Creatinine, Ser 2.720.99 0.44 - 1.00 mg/dL   Calcium 9.3 8.9 - 53.610.3 mg/dL   Total Protein 8.3 (H) 6.5 - 8.1 g/dL   Albumin 4.6 3.5 - 5.0 g/dL   AST 28 15 - 41 U/L   ALT 23 14 - 54 U/L  Alkaline Phosphatase 100 38 - 126 U/L   Total Bilirubin 0.2 (L) 0.3 - 1.2 mg/dL   GFR calc non Af Amer >60 >60 mL/min   GFR calc Af Amer >60 >60 mL/min   Anion gap 11 5 - 15  CBC with Differential     Status: None   Collection Time: 07/03/17 10:35 AM  Result Value Ref Range   WBC 7.1 3.6 - 11.0 K/uL   RBC 4.51 3.80 - 5.20 MIL/uL   Hemoglobin 14.5 12.0 - 16.0 g/dL   HCT 16.142.0 09.635.0 - 04.547.0 %   MCV 93.1 80.0 - 100.0 fL   MCH 32.2 26.0 - 34.0 pg   MCHC 34.6 32.0 - 36.0 g/dL   RDW 40.913.4 81.111.5 - 91.414.5 %   Platelets 225 150 - 440 K/uL   Neutrophils Relative % 65 %   Neutro Abs 4.6 1.4 - 6.5 K/uL   Lymphocytes Relative 28 %   Lymphs Abs 2.0 1.0 - 3.6 K/uL   Monocytes Relative 5 %   Monocytes Absolute 0.3 0.2 - 0.9 K/uL   Eosinophils Relative 1 %   Eosinophils Absolute 0.1 0 - 0.7 K/uL   Basophils Relative 1 %   Basophils Absolute 0.1 0 - 0.1 K/uL   Dg Chest 2 View  Result Date: 07/03/2017 CLINICAL DATA:  Productive cough, shortness of breath. EXAM: CHEST  2 VIEW COMPARISON:  Radiographs of October 15, 2016. FINDINGS: The heart size and mediastinal contours are within normal limits. Both lungs are clear. No pneumothorax or pleural effusion is noted. The visualized skeletal structures are unremarkable. IMPRESSION: No active cardiopulmonary disease. Electronically Signed   By: Lupita RaiderJames  Green Jr, M.D.   On: 07/03/2017 11:01    ED Clinical Impression  Moderate asthma with exacerbation, unspecified whether persistent  Acute bronchitis, unspecified organism   ED  Assessment/Plan  pt PERC negative.  Doubt PE.  Doubt CHF.  She is satting 99% on room air.  [place] Narcotic database reviewed for this patient, and feel that the risk/benefit ratio today is favorable for proceeding with a prescription for controlled substance.   Reviewed imaging independently.  Normal chest x-ray. See radiology report for full details.  CBC, CMP are normal.  Chest x-ray is normal.  Influenza negative.  Re-evaluation, patient states she feels much better.  She has improved air movement and diffuse wheezing throughout all lung fields.  She is satting 100% on room air, heart rate 83, temperature 98.3 orally.  Will give 60 mg of prednisone here for bronchitis with an asthma exacerbation.  Plan to discontinue the Cipro and start her on azithromycin 500 mg for 5 days.  Also, Tussionex, Tessalon, albuterol inhaler with a spacer.  1-2 puffs every 4-6 hours off of this.  Prednisone 40 mg for 5 days, ibuprofen 600 mg to take with 1 g of Tylenol 3-4 times a day.  [facility], gave patient strict ER return precautions.    Discussed labs, imaging, MDM, plan and followup with patient Discussed sn/sx that should prompt return to the ED. patient agrees with plan.   Meds ordered this encounter  Medications  . DISCONTD: predniSONE (DELTASONE) tablet 60 mg  . ipratropium-albuterol (DUONEB) 0.5-2.5 (3) MG/3ML nebulizer solution 3 mL  . predniSONE (DELTASONE) tablet 60 mg  . chlorpheniramine-HYDROcodone (TUSSIONEX PENNKINETIC ER) 10-8 MG/5ML SUER    Sig: Take 5 mLs by mouth every 12 (twelve) hours as needed for cough.  Dispense:  120 mL    Refill:  0  . benzonatate (TESSALON) 200 MG capsule    Sig: Take 1 capsule (200 mg total) by mouth 3 (three) times daily as needed for cough.    Dispense:  30 capsule    Refill:  0  . ibuprofen (ADVIL,MOTRIN) 600 MG tablet    Sig: Take 1 tablet (600 mg total) by mouth every 6 (six) hours as needed.     Dispense:  30 tablet    Refill:  0  . albuterol (PROVENTIL HFA;VENTOLIN HFA) 108 (90 Base) MCG/ACT inhaler    Sig: Inhale 1-2 puffs into the lungs every 6 (six) hours as needed for wheezing or shortness of breath.    Dispense:  1 Inhaler    Refill:  0  . Spacer/Aero-Holding Chambers (AEROCHAMBER PLUS) inhaler    Sig: Use as instructed    Dispense:  1 each    Refill:  2  . azithromycin (ZITHROMAX) 500 MG tablet    Sig: Take 1 tablet (500 mg total) by mouth daily for 5 days.    Dispense:  5 tablet    Refill:  0    *This clinic note was created using Scientist, clinical (histocompatibility and immunogenetics). Therefore, there may be occasional mistakes despite careful proofreading.  ?    Domenick Gong, MD 07/03/17 1156

## 2017-11-16 ENCOUNTER — Ambulatory Visit
Admission: EM | Admit: 2017-11-16 | Discharge: 2017-11-16 | Disposition: A | Discharge status: Home / Self Care | Payer: Non-veteran care | Attending: Family Medicine | Admitting: Family Medicine

## 2017-11-16 ENCOUNTER — Other Ambulatory Visit: Payer: Self-pay

## 2017-11-16 DIAGNOSIS — K068 Other specified disorders of gingiva and edentulous alveolar ridge: Secondary | ICD-10-CM | POA: Diagnosis not present

## 2017-11-16 DIAGNOSIS — R519 Headache, unspecified: Secondary | ICD-10-CM

## 2017-11-16 DIAGNOSIS — R51 Headache: Secondary | ICD-10-CM

## 2017-11-16 MED ORDER — AMOXICILLIN-POT CLAVULANATE 875-125 MG PO TABS: 1.0000 | ORAL_TABLET | Freq: Two times a day (BID) | ORAL | 0 refills | Status: DC

## 2017-11-16 MED ORDER — OXYCODONE-ACETAMINOPHEN 5-325 MG PO TABS: 1.0000 | ORAL_TABLET | Freq: Four times a day (QID) | ORAL | 0 refills | Status: DC | PRN

## 2017-11-16 NOTE — ED Provider Notes (Addendum)
[facility] ____________________________________________  Time seen: Approximately 10:11 AM  I have reviewed the triage vital signs and the nursing notes.   HISTORY  Chief Complaint Shoulder Pain and Jaw Pain   HPI Kylie Lam is a 37 y.o. female presenting for evaluation of right sided gum and jaw pain present since yesterday that has continued to worsen.  Patient states that she does have issues with her left shoulder, but declines evaluation for that at this time.  Reports pain along her gum and right jaw has been constant and moderate to severe.  States feels some swelling along the gumline.  Denies any accompanying fevers, sore throat, difficulty swallowing or throat swelling.  Denies known triggers.  No changes in medications recently.  Denies trauma or injury.  Continues to eat and drink well.  States pain unresolved with over-the-counter Tylenol and ibuprofen.  Drinking fluids in urgent care.  Patient reports she has full dental implants and they have been in place for several years without any acute changes.  Patient also has a tattoo to her right neck, denies any recent tattoo work or changes. Denies chest pain, shortness of breath, abdominal pain, or rash. Denies recent sickness. Denies recent antibiotic use.   No LMP recorded. Patient has had a hysterectomy.  Past Medical History:  Diagnosis Date  . Anxiety   . Depression   . GERD (gastroesophageal reflux disease)   . IBS (irritable bowel syndrome)   . Sleep disorder   . TIA (transient ischemic attack)     There are no active problems to display for this patient.   Past Surgical History:  Procedure Laterality Date  . ANKLE SURGERY Right   . CHOLECYSTECTOMY       No current facility-administered medications for this encounter.   Current Outpatient Medications:  .  albuterol (PROVENTIL HFA;VENTOLIN HFA) 108 (90 Base) MCG/ACT inhaler, Inhale 1-2 puffs into the lungs every 6 (six) hours as needed for  wheezing or shortness of breath., Disp: 1 Inhaler, Rfl: 0 .  amoxicillin-clavulanate (AUGMENTIN) 875-125 MG tablet, Take 1 tablet by mouth every 12 (twelve) hours., Disp: 20 tablet, Rfl: 0 .  busPIRone (BUSPAR) 10 MG tablet, Take 10 mg by mouth 3 (three) times daily., Disp: , Rfl:  .  dicyclomine (BENTYL) 20 MG tablet, Take 20 mg by mouth every 6 (six) hours., Disp: , Rfl:  .  escitalopram (LEXAPRO) 20 MG tablet, Take 20 mg by mouth daily., Disp: , Rfl:  .  gabapentin (NEURONTIN) 600 MG tablet, Take 600 mg by mouth 3 (three) times daily., Disp: , Rfl:  .  ibuprofen (ADVIL,MOTRIN) 600 MG tablet, Take 1 tablet (600 mg total) by mouth every 6 (six) hours as needed., Disp: 30 tablet, Rfl: 0 .  oxyCODONE-acetaminophen (PERCOCET/ROXICET) 5-325 MG tablet, Take 1 tablet by mouth every 6 (six) hours as needed for severe pain. Do not drive while taking as can cause drowsiness., Disp: 4 tablet, Rfl: 0 .  pantoprazole (PROTONIX) 40 MG tablet, Take 40 mg by mouth daily., Disp: , Rfl:  .  prazosin (MINIPRESS) 5 MG capsule, Take 12 mg by mouth at bedtime., Disp: , Rfl:  .  Spacer/Aero-Holding Chambers (AEROCHAMBER PLUS) inhaler, Use as instructed, Disp: 1 each, Rfl: 2 .  SUMAtriptan (IMITREX) 100 MG tablet, Take 100 mg by mouth every 2 (two) hours as needed for migraine. May repeat in 2 hours if headache persists or recurs., Disp: , Rfl:  .  topiramate (TOPAMAX) 200 MG tablet, Take 200 mg by mouth  2 (two) times daily., Disp: , Rfl:  .  traZODone (DESYREL) 100 MG tablet, Take 100 mg by mouth at bedtime., Disp: , Rfl:   Allergies Haldol [haloperidol lactate] and Prozac [fluoxetine hcl]  No family history on file.  Social History Social History   Tobacco Use  . Smoking status: Current Every Day Smoker    Packs/day: 1.00    Types: Cigarettes  . Smokeless tobacco: Current User  Substance Use Topics  . Alcohol use: No  . Drug use: No    Review of Systems Constitutional: No fever/chills ENT: No sore  throat. Cardiovascular: Denies chest pain. Respiratory: Denies shortness of breath. Gastrointestinal: No abdominal pain.  Musculoskeletal: Negative for neck pain. Skin: Negative for rash. Neurological: Negative forfocal weakness or numbness.    ____________________________________________   PHYSICAL EXAM:  VITAL SIGNS: ED Triage Vitals  Enc Vitals Group     BP 11/16/17 0948 122/83     Pulse Rate 11/16/17 0948 (!) 59     Resp 11/16/17 0948 18     Temp 11/16/17 0948 98.1 F (36.7 C)     Temp Source 11/16/17 0948 Oral     SpO2 11/16/17 0948 98 %     Weight 11/16/17 0946 175 lb (79.4 kg)     Height 11/16/17 0946 5\' 9"  (1.753 m)     Head Circumference --      Peak Flow --      Pain Score 11/16/17 0946 7     Pain Loc --      Pain Edu? --      Excl. in GC? --     Constitutional: Alert and oriented. Well appearing and in no acute distress. Eyes: Conjunctivae are normal. ENT      Head: Normocephalic and atraumatic.  No TMJ tenderness.full jaw range of motion.       Ears: Nontender, no erythema, normal TMs bilaterally.  No mastoid tenderness bilaterally.  No surrounding tenderness, swelling or erythema bilaterally.      Nose: No congestion/rhinnorhea.      Mouth/Throat: Mucous membranes are moist.Oropharynx non-erythematous.  No tonsillar swelling or exudate.  No tongue or posterior pharyngeal edema noted.  Right lower gumline along implants and into cheek erythema and mild edema noted and moderate tenderness to direct palpation, dental implants noted with no direct implant tenderness.  No abscess visible.  No sores or lesions noted. Neck: No stridor. Supple without meningismus.  Right lateral lower face superior and immediately inferior to right jaw diffuse tenderness to palpation, no point bony tenderness, no swelling visible, no erythema, no induration, no rash. Hematological/Lymphatic/Immunilogical: No cervical lymphadenopathy. Cardiovascular: Normal rate, regular rhythm.  Grossly normal heart sounds.  Good peripheral circulation. Respiratory: Normal respiratory effort without tachypnea nor retractions. Breath sounds are clear and equal bilaterally. No wheezes, rales, rhonchi. Musculoskeletal: Steady gait.  Changes positions quickly in room. Neurologic:  Normal speech and language.  Speech is normal. No gait instability.  Skin:  Skin is warm, dry and intact. No rash noted. Psychiatric: Mood and affect are normal. Speech and behavior are normal. Patient exhibits appropriate insight and judgment   ___________________________________________   LABS (all labs ordered are listed, but only abnormal results are displayed)  Labs Reviewed - No data to display   PROCEDURES Procedures    INITIAL IMPRESSION / ASSESSMENT AND PLAN / ED COURSE  Pertinent labs & imaging results that were available during my care of the patient were reviewed by me and considered in my medical decision making (see  chart for details).  Overall well-appearing patient.  Appears in pain.  No acute distress.  Right lower gum and immediate surrounding tenderness, gumline erythema and swelling noted.  No visible abscess or palpated induration.  Denies trauma and no point bony tenderness.  Concern for infection, also discussed possible gumline dental infection versus sialoadenitis.  Will empirically start patient on oral Augmentin.  Patient requests pain medication, Rx 4 given.  [place] controlled substance database reviewed, Percocet multiple past medications, none since 08/18/2017.  Encourage rest, fluids, supportive care.  Discussed close monitoring.  Follow-up with primary and dentist.  Discussed for any worsening pain or concerns, proceeding directly to the emergency room for further evaluation.Discussed indication, risks and benefits of medications with patient.   Discussed follow up and return parameters including no resolution or any worsening concerns. Patient verbalized understanding  and agreed to plan.   Substance report:  08/18/2017 1 07/22/2017 Fiorinal 50-325-40 Mg Capsule 10 30 An Cot [id number] Dep (7447) 1 0.17 [facility] [place] 07/22/2017 1 07/22/2017 Fiorinal 50-325-40 Mg Capsule 10 30 An Cot [id number] Dep (7447) 0 0.17 [facility] [place] 07/14/2017 1 07/14/2017 Tramadol Hcl 50 Mg Tablet 12 5 [place] [id number] Dep (7447) 0 12.00 [facility] [place] 06/13/2017 1 03/29/2017 Clonazepam 1 Mg Tablet 15 30 [place] [id number] Dep (7447) 3 1.00 [facility] [place] 06/03/2017 1 06/03/2017 Tramadol Hcl 50    ____________________________________________   FINAL CLINICAL IMPRESSION(S) / ED DIAGNOSES  Final diagnoses:  Pain in gums  Right-sided face pain     ED Discharge Orders        Ordered    oxyCODONE-acetaminophen (PERCOCET/ROXICET) 5-325 MG tablet  Every 6 hours PRN     11/16/17 1010    amoxicillin-clavulanate (AUGMENTIN) 875-125 MG tablet  Every 12 hours     11/16/17 1010       Note: This dictation was prepared with Dragon dictation along with smaller phrase technology. Any transcriptional errors that result from this process are unintentional.         Renford Dills, NP 11/16/17 1328    Renford Dills, NP 11/16/17 1328

## 2017-11-16 NOTE — ED Triage Notes (Signed)
Pt with left shoulder pain and popping starting two weeks ago. Unrelated, she woke this a.m. With severe right jaw pain and feels swollen. 10/10

## 2017-11-16 NOTE — Discharge Instructions (Signed)
Take medication as prescribed. Rest. Drink plenty of fluids.   Follow up with your primary care physician or Dentist this week as needed. Return to Urgent care as needed.  As discussed for any worsening complaints or pain proceed directly to emergency room.

## 2017-12-18 ENCOUNTER — Other Ambulatory Visit: Payer: Self-pay

## 2017-12-18 ENCOUNTER — Emergency Department
Admission: EM | Admit: 2017-12-18 | Discharge: 2017-12-18 | Discharge status: Against Medical Advice | Payer: Non-veteran care | Attending: Emergency Medicine | Admitting: Emergency Medicine

## 2017-12-18 ENCOUNTER — Encounter: Payer: Self-pay | Admitting: Emergency Medicine

## 2017-12-18 DIAGNOSIS — Z79899 Other long term (current) drug therapy: Secondary | ICD-10-CM | POA: Diagnosis not present

## 2017-12-18 DIAGNOSIS — S4992XA Unspecified injury of left shoulder and upper arm, initial encounter: Secondary | ICD-10-CM | POA: Diagnosis present

## 2017-12-18 DIAGNOSIS — F17210 Nicotine dependence, cigarettes, uncomplicated: Secondary | ICD-10-CM | POA: Insufficient documentation

## 2017-12-18 DIAGNOSIS — Y929 Unspecified place or not applicable: Secondary | ICD-10-CM | POA: Diagnosis not present

## 2017-12-18 DIAGNOSIS — Y999 Unspecified external cause status: Secondary | ICD-10-CM | POA: Diagnosis not present

## 2017-12-18 DIAGNOSIS — Y939 Activity, unspecified: Secondary | ICD-10-CM | POA: Diagnosis not present

## 2017-12-18 DIAGNOSIS — S40212A Abrasion of left shoulder, initial encounter: Secondary | ICD-10-CM | POA: Insufficient documentation

## 2017-12-18 LAB — URINALYSIS, COMPLETE (UACMP) WITH MICROSCOPIC
BILIRUBIN URINE: NEGATIVE
GLUCOSE, UA: NEGATIVE mg/dL
Hgb urine dipstick: NEGATIVE
Ketones, ur: NEGATIVE mg/dL
Leukocytes, UA: NEGATIVE
Nitrite: NEGATIVE
PROTEIN: NEGATIVE mg/dL
Specific Gravity, Urine: 1.003 — ABNORMAL LOW (ref 1.005–1.030)
pH: 7 (ref 5.0–8.0)

## 2017-12-18 LAB — POCT PREGNANCY, URINE: PREG TEST UR: NEGATIVE

## 2017-12-18 MED ORDER — IBUPROFEN 800 MG PO TABS
800.0000 mg | ORAL_TABLET | Freq: Once | ORAL | Status: AC
  Administered 2017-12-18: 800 mg via ORAL
  Filled 2017-12-18: qty 1
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
## 2017-12-18 NOTE — ED Notes (Signed)
Office Allyson Sabal reports to call [place] [employer] non emergency line when patient is discharged to come pick patient up and take her home.

## 2017-12-18 NOTE — ED Notes (Signed)
[place] [employer] here to get patient. Pt ambulatory to discharge.
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
## 2017-12-18 NOTE — SANE Note (Signed)
At [phone], I received a call from ED staff about this patient. I spoke with MD who gave me a report. I informed that I would be at the hospital within the hour. At 1000, I received a call from ED staff that patient had left stating she did not want to wait.  Per report, patient stated, "I've had like 16 of these done before. I don't want to wait."
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
## 2017-12-29 ENCOUNTER — Encounter: Payer: Self-pay | Admitting: Emergency Medicine

## 2017-12-29 ENCOUNTER — Other Ambulatory Visit: Payer: Self-pay

## 2017-12-29 ENCOUNTER — Ambulatory Visit
Admission: EM | Admit: 2017-12-29 | Discharge: 2017-12-29 | Disposition: A | Discharge status: Nursing Home (Includes ICF) | Payer: Non-veteran care | Attending: Family Medicine | Admitting: Family Medicine

## 2017-12-29 DIAGNOSIS — R45851 Suicidal ideations: Secondary | ICD-10-CM
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
## 2018-02-11 ENCOUNTER — Other Ambulatory Visit: Payer: Self-pay

## 2018-02-11 ENCOUNTER — Ambulatory Visit
Admission: EM | Admit: 2018-02-11 | Discharge: 2018-02-11 | Disposition: A | Discharge status: Home / Self Care | Payer: Non-veteran care | Attending: Family Medicine | Admitting: Family Medicine

## 2018-02-11 ENCOUNTER — Ambulatory Visit (INDEPENDENT_AMBULATORY_CARE_PROVIDER_SITE_OTHER): Payer: Non-veteran care

## 2018-02-11 DIAGNOSIS — R319 Hematuria, unspecified: Secondary | ICD-10-CM | POA: Diagnosis not present

## 2018-02-11 DIAGNOSIS — R509 Fever, unspecified: Secondary | ICD-10-CM

## 2018-02-11 DIAGNOSIS — R109 Unspecified abdominal pain: Secondary | ICD-10-CM | POA: Diagnosis not present

## 2018-02-11 DIAGNOSIS — M545 Low back pain: Secondary | ICD-10-CM

## 2018-02-11 DIAGNOSIS — R350 Frequency of micturition: Secondary | ICD-10-CM

## 2018-02-11 LAB — CBC WITH DIFFERENTIAL/PLATELET
BASOS PCT: 1 %
Basophils Absolute: 0 10*3/uL (ref 0–0.1)
Eosinophils Absolute: 0 10*3/uL (ref 0–0.7)
Eosinophils Relative: 1 %
HEMATOCRIT: 43.1 % (ref 35.0–47.0)
HEMOGLOBIN: 14.7 g/dL (ref 12.0–16.0)
Lymphocytes Relative: 24 %
Lymphs Abs: 1.7 10*3/uL (ref 1.0–3.6)
MCH: 32 pg (ref 26.0–34.0)
MCHC: 34.2 g/dL (ref 32.0–36.0)
MCV: 93.6 fL (ref 80.0–100.0)
Monocytes Absolute: 0.3 10*3/uL (ref 0.2–0.9)
Monocytes Relative: 5 %
NEUTROS ABS: 4.8 10*3/uL (ref 1.4–6.5)
NEUTROS PCT: 69 %
Platelets: 187 10*3/uL (ref 150–440)
RBC: 4.6 MIL/uL (ref 3.80–5.20)
RDW: 13.7 % (ref 11.5–14.5)
WBC: 6.8 10*3/uL (ref 3.6–11.0)

## 2018-02-11 LAB — URINALYSIS, COMPLETE (UACMP) WITH MICROSCOPIC
BACTERIA UA: NONE SEEN
Bilirubin Urine: NEGATIVE
GLUCOSE, UA: NEGATIVE mg/dL
Ketones, ur: NEGATIVE mg/dL
Leukocytes, UA: NEGATIVE
Nitrite: NEGATIVE
PH: 7 (ref 5.0–8.0)
Protein, ur: NEGATIVE mg/dL
Specific Gravity, Urine: 1.01 (ref 1.005–1.030)

## 2018-02-11 LAB — BASIC METABOLIC PANEL
Anion gap: 12 (ref 5–15)
BUN: 17 mg/dL (ref 6–20)
CALCIUM: 9.2 mg/dL (ref 8.9–10.3)
CO2: 27 mmol/L (ref 22–32)
CREATININE: 1.12 mg/dL — AB (ref 0.44–1.00)
Chloride: 99 mmol/L (ref 98–111)
GFR calc non Af Amer: >60 mL/min (ref >60)
Glucose, Bld: 101 mg/dL — ABNORMAL HIGH (ref 70–99)
Potassium: 4.2 mmol/L (ref 3.5–5.1)
Sodium: 138 mmol/L (ref 135–145)

## 2018-02-11 MED ORDER — ONDANSETRON 8 MG PO TBDP
8.0000 mg | ORAL_TABLET | Freq: Once | ORAL | Status: AC
  Administered 2018-02-11: 8 mg via ORAL

## 2018-02-11 MED ORDER — TAMSULOSIN HCL 0.4 MG PO CAPS: 0.4000 mg | ORAL_CAPSULE | Freq: Every day | ORAL | 0 refills | Status: AC

## 2018-02-11 NOTE — ED Provider Notes (Signed)
[facility] ____________________________________________  Time seen: Approximately 2:34 PM  I have reviewed the triage vital signs and the nursing notes.   HISTORY  Chief Complaint Flank Pain   HPI Donna Snooks is a 37 y.o. female presenting for evaluation of right right flank pain and hematuria present for the last 1 week acutely worsening over 1 day.  States she has a history of previous urinary tract infections and kidney stones with similar presentation.  Reports  fever up to 101 today and she took Tylenol, stating this is partly 2 hours prior to arrival, no fever prior.  States today with some accompanying nausea, no vomiting.  Does have some diarrhea today, but also states she has IBS and that is consistent with her normal IBS.  States of the last 1 week she has been noticing some intermittent urinary frequency and blood in her urine and occasional right flank pain, but reports pain worsened today and is moderate to severe.  No other alleviating measures attempted.  Denies fall or direct trauma.  States similar to previous kidney stones. Also reports previous cholecystectomy, and total hysterectomy.  Denies vaginal discharge, vaginal complaints or concerns of STDs.  Denies other aggravating or alleviating factors.Denies chest pain, shortness of breath, or rash. Denies recent sickness. Denies recent antibiotic use.    Past Medical History:  Diagnosis Date  . Anxiety   . Depression   . GERD (gastroesophageal reflux disease)   . IBS (irritable bowel syndrome)   . Sleep disorder   . TIA (transient ischemic attack)     There are no active problems to display for this patient.   Past Surgical History:  Procedure Laterality Date  . ABDOMINAL HYSTERECTOMY     complete  . ANKLE SURGERY Right   . CHOLECYSTECTOMY       No current facility-administered medications for this encounter.   Current Outpatient Medications:  .  albuterol (PROVENTIL HFA;VENTOLIN HFA) 108 (90  Base) MCG/ACT inhaler, Inhale 1-2 puffs into the lungs every 6 (six) hours as needed for wheezing or shortness of breath., Disp: 1 Inhaler, Rfl: 0 .  busPIRone (BUSPAR) 10 MG tablet, Take 10 mg by mouth 3 (three) times daily., Disp: , Rfl:  .  dicyclomine (BENTYL) 20 MG tablet, Take 20 mg by mouth every 6 (six) hours., Disp: , Rfl:  .  escitalopram (LEXAPRO) 20 MG tablet, Take 20 mg by mouth daily., Disp: , Rfl:  .  gabapentin (NEURONTIN) 600 MG tablet, Take 600 mg by mouth 3 (three) times daily., Disp: , Rfl:  .  ibuprofen (ADVIL,MOTRIN) 600 MG tablet, Take 1 tablet (600 mg total) by mouth every 6 (six) hours as needed., Disp: 30 tablet, Rfl: 0 .  pantoprazole (PROTONIX) 40 MG tablet, Take 40 mg by mouth daily., Disp: , Rfl:  .  prazosin (MINIPRESS) 5 MG capsule, Take 12 mg by mouth at bedtime., Disp: , Rfl:  .  Spacer/Aero-Holding Chambers (AEROCHAMBER PLUS) inhaler, Use as instructed, Disp: 1 each, Rfl: 2 .  SUMAtriptan (IMITREX) 100 MG tablet, Take 100 mg by mouth every 2 (two) hours as needed for migraine. May repeat in 2 hours if headache persists or recurs., Disp: , Rfl:  .  traZODone (DESYREL) 100 MG tablet, Take 100 mg by mouth at bedtime., Disp: , Rfl:  .  tamsulosin (FLOMAX) 0.4 MG CAPS capsule, Take 1 capsule (0.4 mg total) by mouth daily for 7 days., Disp: 7 capsule, Rfl: 0  Allergies Baclofen; Haldol [haloperidol lactate]; and Prozac [fluoxetine hcl]  History reviewed. No pertinent family history.  Social History Social History   Tobacco Use  . Smoking status: Current Every Day Smoker    Packs/day: 1.00    Types: Cigarettes  . Smokeless tobacco: Current User  Substance Use Topics  . Alcohol use: No  . Drug use: Yes    Types: Marijuana    Comment: occasional    Review of Systems Constitutional: As above.  ENT: No sore throat. Cardiovascular: Denies chest pain. Respiratory: Denies shortness of breath. Gastrointestinal: As above.  Genitourinary: Negative for  dysuria. Musculoskeletal: As above.  Skin: Negative for rash.   ____________________________________________   PHYSICAL EXAM:  VITAL SIGNS: ED Triage Vitals  Enc Vitals Group     BP 02/11/18 1415 105/82     Pulse Rate 02/11/18 1415 62     Resp 02/11/18 1415 18     Temp 02/11/18 1415 98.9 F (37.2 C)     Temp Source 02/11/18 1415 Oral     SpO2 02/11/18 1415 97 %     Weight 02/11/18 1413 174 lb (78.9 kg)     Height 02/11/18 1413 5\' 9"  (1.753 m)     Head Circumference --      Peak Flow --      Pain Score 02/11/18 1413 8     Pain Loc --      Pain Edu? --      Excl. in GC? --     Constitutional: Alert and oriented. Well appearing and in no acute distress. ENT      Head: Normocephalic and atraumatic.      Mouth/Throat: Mucous membranes are moist. Cardiovascular: Normal rate, regular rhythm. Grossly normal heart sounds.  Good peripheral circulation. Respiratory: Normal respiratory effort without tachypnea nor retractions. Breath sounds are clear and equal bilaterally. No wheezes, rales, rhonchi. Gastrointestinal: No left CVA tenderness.  Moderate right CVA tenderness.  No midline tenderness.  Abdomen with diffuse mild tenderness, mild to moderate right diffuse abdominal tenderness and moderate right flank tenderness. Musculoskeletal: Steady gait.  No midline cervical, thoracic or lumbar tenderness to palpation.  Neurologic:  Normal speech and language. Speech is normal. No gait instability.  Skin:  Skin is warm, dry and intact. No rash noted. Psychiatric: Mood and affect are normal. Speech and behavior are normal. Patient exhibits appropriate insight and judgment.    ___________________________________________   LABS (all labs ordered are listed, but only abnormal results are displayed)  Labs Reviewed  URINALYSIS, COMPLETE (UACMP) WITH MICROSCOPIC - Abnormal; Notable for the following components:      Result Value   Hgb urine dipstick LARGE (*)    All other components  within normal limits  BASIC METABOLIC PANEL - Abnormal; Notable for the following components:   Glucose, Bld 101 (*)    Creatinine, Ser 1.12 (*)    All other components within normal limits  CBC WITH DIFFERENTIAL/PLATELET   ____________________________________________  RADIOLOGY  Ct Renal Stone Study  Result Date: 02/11/2018 CLINICAL DATA:  Right flank pain EXAM: CT ABDOMEN AND PELVIS WITHOUT CONTRAST TECHNIQUE: Multidetector CT imaging of the abdomen and pelvis was performed following the standard protocol without oral or IV contrast. COMPARISON:  None. FINDINGS: Lower chest: Lung bases are clear. Hepatobiliary: No focal liver lesions are apparent on this noncontrast enhanced study. Gallbladder is absent. There is no appreciable biliary duct dilatation. Pancreas: There is no pancreatic mass or inflammatory focus. Spleen: No splenic lesions are evident. Adrenals/Urinary Tract: Adrenals bilaterally appear normal. Kidneys bilaterally show no evident mass  or hydronephrosis. There is mild nephrocalcinosis bilaterally. No well-defined calculus is evident in either kidney. There is no appreciable ureteral calculus on either side. Urinary bladder is midline with wall thickness within normal limits. Stomach/Bowel: There is no appreciable bowel wall or mesenteric thickening. There is no evident bowel obstruction. No free air or portal venous air. Vascular/Lymphatic: No abdominal aortic aneurysm. No vascular lesions are evident. There is a circumaortic left renal vein, an anatomic variant. No adenopathy is appreciable in the abdomen or pelvis. Reproductive: Uterus is absent. No pelvic mass evident. There is a metallic foreign body at the level of the labia. Other: Appendix appears normal. No ascites or abscess is evident in the abdomen or pelvis. There is a small ventral hernia containing only fat. Musculoskeletal: There are several Schmorl's nodes in the lumbar region. No blastic or lytic bone lesions are  evident. There is no intramuscular lesion. IMPRESSION: 1. Nephrocalcinosis bilaterally. No ureteral calculus or hydronephrosis on either side. 2. No evident bowel obstruction. No abscess in the abdomen or pelvis. Appendix appears normal. 3.  Gallbladder and uterus absent. 4.  Small ventral hernia containing only fat. Electronically Signed   By: Bretta BangWilliam  Woodruff III M.D.   On: 02/11/2018 15:16   ____________________________________________   PROCEDURES Procedures   INITIAL IMPRESSION / ASSESSMENT AND PLAN / ED COURSE  Pertinent labs & imaging results that were available during my care of the patient were reviewed by me and considered in my medical decision making (see chart for details).  Patient overall well-appearing, but does not appear to be in pain.  Patient guarding right flank intermittently in room.  Urinalysis reviewed reviewed and noted to have hematuria no bacteria.  Discussed with patient concern for nephrolithiasis, but also discussed other differentials including renal colic, musculoskeletal, pyelonephritis, appendicitis.  Will evaluate CBC, BMP and CT renal.  Labs reviewed and discussed with patient.  Creatinine noted and discussed, encourage fluid intake.  CT renal as above per radiologist.  Per radiologist no ureteral calculus or hydronephrosis, appendix appears normal and no evidence of bowel obstruction.  Discussed in detail with patient not clear etiology of her pain.  Discussed possible renal colic and discussed use of Toradol and Flomax.  Patient request pain medication.  discussed with patient at this time as unclear source recommend supportive care including toradol and flomax.  Patient again request pain medication including "ultram", and discussed with patient if pain is continuing to worsen as she states that it is, recommend further evaluation in emergency room as unclear cause.  Patient states that she will take Flomax first and proceed to the ER if no resolution.  Patient  declined Toradol injection in urgent care stating that it does not work for her.Discussed indication, risks and benefits of medications with patient.   Discussed follow up with Primary care physician this week. Discussed follow up and return parameters including no resolution or any worsening concerns. Patient verbalized understanding and agreed to plan.   ____________________________________________   FINAL CLINICAL IMPRESSION(S) / ED DIAGNOSES  Final diagnoses:  Flank pain  Hematuria, unspecified type     ED Discharge Orders        Ordered    tamsulosin (FLOMAX) 0.4 MG CAPS capsule  Daily     02/11/18 1547       Note: This dictation was prepared with Dragon dictation along with smaller phrase technology. Any transcriptional errors that result from this process are unintentional.         Renford DillsMiller, Dainelle Hun, NP 02/11/18  1654  

## 2018-02-11 NOTE — Discharge Instructions (Signed)
Take medication as prescribed. Rest. Drink plenty of fluids.   Follow up with your primary care physician this week as needed. Return to Urgent care or Emergency room, for new or worsening concerns.

## 2018-02-11 NOTE — ED Triage Notes (Signed)
Patient complains of flank pain, low back pain and hematuria that started 1 week ago and worsened recently. Patient has a hx of kidney stones and symptoms feel similar.

## 2018-03-31 ENCOUNTER — Encounter: Payer: Self-pay | Admitting: Emergency Medicine

## 2018-03-31 ENCOUNTER — Ambulatory Visit
Admission: EM | Admit: 2018-03-31 | Discharge: 2018-03-31 | Disposition: A | Discharge status: Home / Self Care | Payer: Non-veteran care | Attending: Family Medicine | Admitting: Family Medicine

## 2018-03-31 ENCOUNTER — Other Ambulatory Visit: Payer: Self-pay

## 2018-03-31 DIAGNOSIS — W260XXA Contact with knife, initial encounter: Secondary | ICD-10-CM | POA: Diagnosis not present

## 2018-03-31 DIAGNOSIS — S61512A Laceration without foreign body of left wrist, initial encounter: Secondary | ICD-10-CM | POA: Diagnosis not present

## 2018-03-31 NOTE — ED Triage Notes (Signed)
Pt c/o laceration on her left forearm. She was cutting asparagus and the knife slipped.

## 2018-03-31 NOTE — ED Provider Notes (Signed)
[facility]    CSN: [id number] Arrival date & time: 03/31/18  1608  History   Chief Complaint Chief Complaint  Patient presents with  . Extremity Laceration   HPI  37 year old female presents with a laceration to her left wrist.  Occurred just prior to coming in.  She was making a frittata.  She states that her hand slipped and she cut her wrist as she was trying to cut asparagus.  Patient reports moderate pain of the area.  She reports associated swelling.  She states that she cleaned the wound.  No bleeding currently.  She states her tetanus is up-to-date and was given to her in the past 1 to 1-1/2 years; done at the [place].  She reports numbness in the first digit.  No other reported symptoms.  No other complaints or concerns at this time.  Past Medical History:  Diagnosis Date  . Anxiety   . Depression   . GERD (gastroesophageal reflux disease)   . IBS (irritable bowel syndrome)   . Sleep disorder   . TIA (transient ischemic attack)    Past Surgical History:  Procedure Laterality Date  . ABDOMINAL HYSTERECTOMY     complete  . ANKLE SURGERY Right   . CHOLECYSTECTOMY     OB History   None    Home Medications    Prior to Admission medications   Medication Sig Start Date End Date Taking? Authorizing Provider  albuterol (PROVENTIL HFA;VENTOLIN HFA) 108 (90 Base) MCG/ACT inhaler Inhale 1-2 puffs into the lungs every 6 (six) hours as needed for wheezing or shortness of breath. 07/03/17  Yes Domenick Gong, MD  busPIRone (BUSPAR) 10 MG tablet Take 10 mg by mouth 3 (three) times daily.   Yes [provider]  dicyclomine (BENTYL) 20 MG tablet Take 20 mg by mouth every 6 (six) hours.   Yes [provider]  escitalopram (LEXAPRO) 20 MG tablet Take 20 mg by mouth daily.   Yes [provider]  gabapentin (NEURONTIN) 600 MG tablet Take 600 mg by mouth 3 (three) times daily.   Yes [provider]  ibuprofen (ADVIL,MOTRIN) 600 MG tablet  Take 1 tablet (600 mg total) by mouth every 6 (six) hours as needed. 07/03/17  Yes Domenick Gong, MD  pantoprazole (PROTONIX) 40 MG tablet Take 40 mg by mouth daily.   Yes [provider]  prazosin (MINIPRESS) 5 MG capsule Take 12 mg by mouth at bedtime.   Yes [provider]  SUMAtriptan (IMITREX) 100 MG tablet Take 100 mg by mouth every 2 (two) hours as needed for migraine. May repeat in 2 hours if headache persists or recurs.   Yes [provider]  traZODone (DESYREL) 100 MG tablet Take 100 mg by mouth at bedtime.   Yes [provider]  Spacer/Aero-Holding Chambers (AEROCHAMBER PLUS) inhaler Use as instructed 07/03/17   Domenick Gong, MD    Family History History reviewed. No pertinent family history.  Social History Social History   Tobacco Use  . Smoking status: Current Every Day Smoker    Packs/day: 1.00    Types: Cigarettes  . Smokeless tobacco: Current User  Substance Use Topics  . Alcohol use: No  . Drug use: Yes    Types: Marijuana    Comment: occasional     Allergies   Baclofen; Haldol [haloperidol lactate]; and Prozac [fluoxetine hcl]   Review of Systems Review of Systems  Constitutional: Negative.   Skin: Positive for wound.   Physical Exam Triage  Vital Signs ED Triage Vitals [03/31/18 1622]  Enc Vitals Group     BP (!) 126/102     Pulse Rate 65     Resp 16     Temp 98.4 F (36.9 C)     Temp Source Oral     SpO2 99 %     Weight 165 lb (74.8 kg)     Height 5\' 9"  (1.753 m)     Head Circumference      Peak Flow      Pain Score 8     Pain Loc      Pain Edu?      Excl. in GC?    Updated Vital Signs BP (!) 126/102 (BP Location: Right Arm)   Pulse 65   Temp 98.4 F (36.9 C) (Oral)   Resp 16   Ht 5\' 9"  (1.753 m)   Wt 74.8 kg   SpO2 99%   BMI 24.37 kg/m   Visual Acuity Right Eye Distance:   Left Eye Distance:   Bilateral Distance:    Right Eye Near:   Left Eye Near:    Bilateral Near:      Physical Exam  Constitutional: She is oriented to person, place, and time. She appears well-developed. No distress.  HENT:  Head: Normocephalic and atraumatic.  Pulmonary/Chest: Effort normal. No respiratory distress.  Neurological: She is alert and oriented to person, place, and time.  Skin:  Left wrist - small, 2 cm superficial laceration.  Underlying tendon visible but no injury to the tendon.  Patient appears to have a small hematoma.  Psychiatric: Her behavior is normal.  Flat affect. Depressed.   Nursing note and vitals reviewed.  UC Treatments / Results  Labs (all labs ordered are listed, but only abnormal results are displayed) Labs Reviewed - No data to display  EKG None  Radiology No results found.  Procedures Laceration Repair Date/Time: 03/31/2018 5:03 PM Performed by: Tommie Samsook, Kacyn Souder G, DO Authorized by: Tommie Samsook, Shakelia Scrivner G, DO   Consent:    Consent obtained:  Verbal   Consent given by:  Patient Anesthesia (see MAR for exact dosages):    Anesthesia method:  Local infiltration   Local anesthetic:  Lidocaine 1% WITH epi Laceration details:    Location:  Hand   Hand location:  L wrist   Length (cm):  2 Repair type:    Repair type:  Simple Pre-procedure details:    Preparation:  Patient was prepped and draped in usual sterile fashion Exploration:    Wound exploration: entire depth of wound probed and visualized     Wound extent: no foreign bodies/material noted and no tendon damage noted     Contaminated: no   Treatment:    Area cleansed with:  Betadine   Irrigation solution:  Sterile water   Irrigation method:  Syringe Skin repair:    Repair method:  Sutures   Suture size:  4-0   Suture material:  Nylon   Suture technique:  Simple interrupted   Number of sutures:  3 Approximation:    Approximation:  Close Post-procedure details:    Dressing:  Bulky dressing   Patient tolerance of procedure:  Tolerated well, no immediate complications   (including  critical care time)  Medications Ordered in UC Medications - No data to display  Initial Impression / Assessment and Plan / UC Course  I have reviewed the triage vital signs and the nursing notes.  Pertinent labs & imaging results that were available during  my care of the patient were reviewed by me and considered in my medical decision making (see chart for details).    37 year old female presents with a laceration.  Repaired as above.  Sutures out in 7 days.  Final Clinical Impressions(s) / UC Diagnoses   Final diagnoses:  Laceration of left wrist, initial encounter     Discharge Instructions     Call with concerns.  Keep the wound clean. Ice, elevation. Ibuprofen as needed.  Leave dressing on for 24 hours.  Sutures out in 7 days.  Take care  Dr. Adriana Simas      ED Prescriptions    None     Controlled Substance Prescriptions [place] Controlled Substance Registry consulted? Not Applicable   Tommie Sams, DO 03/31/18 1705

## 2018-03-31 NOTE — Discharge Instructions (Signed)
Call with concerns.  Keep the wound clean. Ice, elevation. Ibuprofen as needed.  Leave dressing on for 24 hours.  Sutures out in 7 days.  Take care  Dr. Adriana Simasook

## 2018-05-08 ENCOUNTER — Other Ambulatory Visit: Payer: Self-pay

## 2018-05-08 ENCOUNTER — Ambulatory Visit
Admission: EM | Admit: 2018-05-08 | Discharge: 2018-05-08 | Disposition: A | Discharge status: Home / Self Care | Payer: Non-veteran care | Attending: Family Medicine | Admitting: Family Medicine

## 2018-05-08 ENCOUNTER — Encounter: Payer: Self-pay | Admitting: Emergency Medicine

## 2018-05-08 DIAGNOSIS — R300 Dysuria: Secondary | ICD-10-CM

## 2018-05-08 DIAGNOSIS — Z760 Encounter for issue of repeat prescription: Secondary | ICD-10-CM

## 2018-05-08 DIAGNOSIS — J069 Acute upper respiratory infection, unspecified: Secondary | ICD-10-CM | POA: Diagnosis not present

## 2018-05-08 DIAGNOSIS — F4310 Post-traumatic stress disorder, unspecified: Secondary | ICD-10-CM

## 2018-05-08 DIAGNOSIS — B9789 Other viral agents as the cause of diseases classified elsewhere: Secondary | ICD-10-CM | POA: Diagnosis not present

## 2018-05-08 HISTORY — DX: Post-traumatic stress disorder, unspecified: F43.10

## 2018-05-08 HISTORY — DX: Adult sexual abuse, confirmed, initial encounter: T74.21XA

## 2018-05-08 HISTORY — DX: Personal history of self-harm: Z91.5

## 2018-05-08 HISTORY — DX: Personal history of suicidal behavior: Z91.51

## 2018-05-08 LAB — URINALYSIS, COMPLETE (UACMP) WITH MICROSCOPIC
BACTERIA UA: NONE SEEN
Bilirubin Urine: NEGATIVE
GLUCOSE, UA: NEGATIVE mg/dL
Hgb urine dipstick: NEGATIVE
KETONES UR: NEGATIVE mg/dL
Leukocytes, UA: NEGATIVE
Nitrite: NEGATIVE
PH: 7 (ref 5.0–8.0)
Protein, ur: NEGATIVE mg/dL
RBC / HPF: NONE SEEN RBC/hpf (ref 0–5)
SPECIFIC GRAVITY, URINE: 1.015 (ref 1.005–1.030)

## 2018-05-08 LAB — RAPID STREP SCREEN (MED CTR MEBANE ONLY): Streptococcus, Group A Screen (Direct): NEGATIVE

## 2018-05-08 MED ORDER — DIVALPROEX SODIUM 250 MG PO DR TAB: 250.0000 mg | DELAYED_RELEASE_TABLET | Freq: Three times a day (TID) | ORAL | 0 refills | Status: DC

## 2018-05-08 MED ORDER — CLONAZEPAM 1 MG PO TABS: 0.5000 mg | ORAL_TABLET | Freq: Three times a day (TID) | ORAL | 0 refills | Status: DC
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
## 2018-05-08 NOTE — ED Triage Notes (Signed)
Patient in today requesting refill on Depakote that was started by [facility] for mood stabilization after suicide attempt. Patient has been out of her medication x 3 weeks. [facility] can't get appointment until 06/08/18.  Patient also c/o fever x 4-5 days of 100-100.8 and sore throat.  Patient c/o low back pain, vaginal pain, urinary frequency and dysuria x 1 week. Patient denies vaginal discharge.

## 2018-05-08 NOTE — ED Triage Notes (Signed)
Patient also requesting refill on Klonopin that she has been out of for ~ 2 weeks.

## 2018-05-11 LAB — CULTURE, GROUP A STREP (THRC)

## 2018-07-29 ENCOUNTER — Other Ambulatory Visit: Payer: Self-pay

## 2018-07-29 ENCOUNTER — Emergency Department
Admission: EM | Admit: 2018-07-29 | Discharge: 2018-07-29 | Disposition: A | Discharge status: Home / Self Care | Payer: Non-veteran care | Attending: Emergency Medicine | Admitting: Emergency Medicine

## 2018-07-29 ENCOUNTER — Emergency Department: Payer: Non-veteran care

## 2018-07-29 ENCOUNTER — Encounter: Payer: Self-pay | Admitting: Emergency Medicine

## 2018-07-29 DIAGNOSIS — R1011 Right upper quadrant pain: Secondary | ICD-10-CM | POA: Diagnosis not present

## 2018-07-29 DIAGNOSIS — Z8673 Personal history of transient ischemic attack (TIA), and cerebral infarction without residual deficits: Secondary | ICD-10-CM | POA: Insufficient documentation

## 2018-07-29 DIAGNOSIS — F419 Anxiety disorder, unspecified: Secondary | ICD-10-CM

## 2018-07-29 DIAGNOSIS — Z79899 Other long term (current) drug therapy: Secondary | ICD-10-CM | POA: Insufficient documentation

## 2018-07-29 DIAGNOSIS — R109 Unspecified abdominal pain: Secondary | ICD-10-CM

## 2018-07-29 HISTORY — DX: Unspecified asthma, uncomplicated: J45.909

## 2018-07-29 LAB — URINALYSIS, COMPLETE (UACMP) WITH MICROSCOPIC
Bilirubin Urine: NEGATIVE
Glucose, UA: NEGATIVE mg/dL
Hgb urine dipstick: NEGATIVE
KETONES UR: 80 mg/dL — AB
Leukocytes, UA: NEGATIVE
Nitrite: NEGATIVE
PH: 6 (ref 5.0–8.0)
Protein, ur: NEGATIVE mg/dL
SPECIFIC GRAVITY, URINE: 1.019 (ref 1.005–1.030)

## 2018-07-29 LAB — COMPREHENSIVE METABOLIC PANEL
ALK PHOS: 62 U/L (ref 38–126)
ALT: 84 U/L — AB (ref 0–44)
AST: 64 U/L — ABNORMAL HIGH (ref 15–41)
Albumin: 4.4 g/dL (ref 3.5–5.0)
Anion gap: 14 (ref 5–15)
BILIRUBIN TOTAL: 0.7 mg/dL (ref 0.3–1.2)
BUN: 14 mg/dL (ref 6–20)
CALCIUM: 9.4 mg/dL (ref 8.9–10.3)
CO2: 23 mmol/L (ref 22–32)
CREATININE: 0.89 mg/dL (ref 0.44–1.00)
Chloride: 102 mmol/L (ref 98–111)
GFR calc Af Amer: >60 mL/min (ref >60)
GFR calc non Af Amer: >60 mL/min (ref >60)
GLUCOSE: 115 mg/dL — AB (ref 70–99)
Potassium: 3.1 mmol/L — ABNORMAL LOW (ref 3.5–5.1)
SODIUM: 139 mmol/L (ref 135–145)
Total Protein: 7.7 g/dL (ref 6.5–8.1)

## 2018-07-29 LAB — CBC
HCT: 37.2 % (ref 36.0–46.0)
Hemoglobin: 13.1 g/dL (ref 12.0–15.0)
MCH: 32.2 pg (ref 26.0–34.0)
MCHC: 35.2 g/dL (ref 30.0–36.0)
MCV: 91.4 fL (ref 80.0–100.0)
PLATELETS: 221 10*3/uL (ref 150–400)
RBC: 4.07 MIL/uL (ref 3.87–5.11)
RDW: 12 % (ref 11.5–15.5)
WBC: 6.1 10*3/uL (ref 4.0–10.5)
nRBC: 0 % (ref 0.0–0.2)

## 2018-07-29 LAB — LIPASE, BLOOD: Lipase: 28 U/L (ref 11–51)

## 2018-07-29 MED ORDER — FENTANYL CITRATE (PF) 100 MCG/2ML IJ SOLN
50.0000 ug | Freq: Once | INTRAMUSCULAR | Status: AC
  Administered 2018-07-29: 50 ug via INTRAVENOUS
  Filled 2018-07-29: qty 2

## 2018-07-29 MED ORDER — LORAZEPAM 2 MG/ML IJ SOLN
1.0000 mg | Freq: Once | INTRAMUSCULAR | Status: AC
  Administered 2018-07-29: 1 mg via INTRAVENOUS
  Filled 2018-07-29: qty 1

## 2018-07-29 MED ORDER — IOPAMIDOL (ISOVUE-300) INJECTION 61%: 30.0000 mL | Freq: Once | INTRAVENOUS | Status: DC | PRN

## 2018-07-29 MED ORDER — ONDANSETRON HCL 4 MG/2ML IJ SOLN
4.0000 mg | Freq: Once | INTRAMUSCULAR | Status: AC
  Administered 2018-07-29: 4 mg via INTRAVENOUS
  Filled 2018-07-29: qty 2

## 2018-07-29 MED ORDER — IOPAMIDOL (ISOVUE-300) INJECTION 61%
100.0000 mL | Freq: Once | INTRAVENOUS | Status: AC | PRN
  Administered 2018-07-29: 100 mL via INTRAVENOUS

## 2018-07-29 MED ORDER — SODIUM CHLORIDE 0.9 % IV BOLUS
1000.0000 mL | Freq: Once | INTRAVENOUS | Status: AC
  Administered 2018-07-29: 1000 mL via INTRAVENOUS

## 2018-07-29 NOTE — ED Notes (Signed)
Patient had good relief with pain med, but didn't last long. Patient is crying, hyperventilation, c/o facial numbness. Dr. Alphonzo LemmingsMcShane aware.

## 2018-07-29 NOTE — ED Notes (Signed)
Patient taken to imaging. 
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
## 2018-07-29 NOTE — ED Triage Notes (Signed)
Upper L abdominal pain x 3 days, states got worse last night, nausea without vomiting.

## 2018-07-29 NOTE — ED Notes (Signed)
Patient appears uncomfortable. Patient is moaning, crying. Patient was calm when answering phone. Patient c/o nausea, denies back pain, c/o LUQ pain.

## 2018-08-14 ENCOUNTER — Ambulatory Visit: Payer: Non-veteran care | Attending: Nurse Practitioner | Admitting: Nurse Practitioner

## 2018-09-22 ENCOUNTER — Emergency Department: Payer: No Typology Code available for payment source

## 2018-09-22 ENCOUNTER — Inpatient Hospital Stay
Admission: EM | Admit: 2018-09-22 | Discharge: 2018-09-28 | DRG: 917 | Disposition: A | Discharge status: Other Acute Inpatient Hospital | Payer: No Typology Code available for payment source | Attending: Internal Medicine | Admitting: Internal Medicine

## 2018-09-22 ENCOUNTER — Encounter: Payer: Self-pay | Admitting: Emergency Medicine

## 2018-09-22 DIAGNOSIS — T40601A Poisoning by unspecified narcotics, accidental (unintentional), initial encounter: Secondary | ICD-10-CM | POA: Diagnosis not present

## 2018-09-22 DIAGNOSIS — E870 Hyperosmolality and hypernatremia: Secondary | ICD-10-CM | POA: Diagnosis present

## 2018-09-22 DIAGNOSIS — F419 Anxiety disorder, unspecified: Secondary | ICD-10-CM | POA: Diagnosis present

## 2018-09-22 DIAGNOSIS — T424X1A Poisoning by benzodiazepines, accidental (unintentional), initial encounter: Secondary | ICD-10-CM | POA: Diagnosis present

## 2018-09-22 DIAGNOSIS — F061 Catatonic disorder due to known physiological condition: Secondary | ICD-10-CM

## 2018-09-22 DIAGNOSIS — G92 Toxic encephalopathy: Secondary | ICD-10-CM | POA: Diagnosis present

## 2018-09-22 DIAGNOSIS — R778 Other specified abnormalities of plasma proteins: Secondary | ICD-10-CM

## 2018-09-22 DIAGNOSIS — T407X1A Poisoning by cannabis (derivatives), accidental (unintentional), initial encounter: Secondary | ICD-10-CM | POA: Diagnosis present

## 2018-09-22 DIAGNOSIS — I6783 Posterior reversible encephalopathy syndrome: Secondary | ICD-10-CM | POA: Diagnosis present

## 2018-09-22 DIAGNOSIS — K7200 Acute and subacute hepatic failure without coma: Secondary | ICD-10-CM | POA: Diagnosis present

## 2018-09-22 DIAGNOSIS — N179 Acute kidney failure, unspecified: Secondary | ICD-10-CM | POA: Diagnosis not present

## 2018-09-22 DIAGNOSIS — R740 Nonspecific elevation of levels of transaminase and lactic acid dehydrogenase [LDH]: Secondary | ICD-10-CM

## 2018-09-22 DIAGNOSIS — D72829 Elevated white blood cell count, unspecified: Secondary | ICD-10-CM

## 2018-09-22 DIAGNOSIS — F4310 Post-traumatic stress disorder, unspecified: Secondary | ICD-10-CM | POA: Diagnosis present

## 2018-09-22 DIAGNOSIS — R7989 Other specified abnormal findings of blood chemistry: Secondary | ICD-10-CM

## 2018-09-22 DIAGNOSIS — Z7902 Long term (current) use of antithrombotics/antiplatelets: Secondary | ICD-10-CM

## 2018-09-22 DIAGNOSIS — Z818 Family history of other mental and behavioral disorders: Secondary | ICD-10-CM

## 2018-09-22 DIAGNOSIS — Z794 Long term (current) use of insulin: Secondary | ICD-10-CM

## 2018-09-22 DIAGNOSIS — G936 Cerebral edema: Secondary | ICD-10-CM | POA: Diagnosis present

## 2018-09-22 DIAGNOSIS — J45909 Unspecified asthma, uncomplicated: Secondary | ICD-10-CM | POA: Diagnosis present

## 2018-09-22 DIAGNOSIS — F329 Major depressive disorder, single episode, unspecified: Secondary | ICD-10-CM | POA: Diagnosis present

## 2018-09-22 DIAGNOSIS — Z915 Personal history of self-harm: Secondary | ICD-10-CM

## 2018-09-22 DIAGNOSIS — Z8673 Personal history of transient ischemic attack (TIA), and cerebral infarction without residual deficits: Secondary | ICD-10-CM

## 2018-09-22 DIAGNOSIS — T43621A Poisoning by amphetamines, accidental (unintentional), initial encounter: Secondary | ICD-10-CM | POA: Diagnosis present

## 2018-09-22 DIAGNOSIS — J9601 Acute respiratory failure with hypoxia: Secondary | ICD-10-CM | POA: Diagnosis present

## 2018-09-22 DIAGNOSIS — T796XXA Traumatic ischemia of muscle, initial encounter: Secondary | ICD-10-CM

## 2018-09-22 DIAGNOSIS — J690 Pneumonitis due to inhalation of food and vomit: Secondary | ICD-10-CM | POA: Diagnosis present

## 2018-09-22 DIAGNOSIS — D689 Coagulation defect, unspecified: Secondary | ICD-10-CM | POA: Diagnosis present

## 2018-09-22 DIAGNOSIS — R7401 Elevation of levels of liver transaminase levels: Secondary | ICD-10-CM

## 2018-09-22 DIAGNOSIS — Z7982 Long term (current) use of aspirin: Secondary | ICD-10-CM

## 2018-09-22 DIAGNOSIS — Z833 Family history of diabetes mellitus: Secondary | ICD-10-CM

## 2018-09-22 DIAGNOSIS — M6282 Rhabdomyolysis: Secondary | ICD-10-CM | POA: Diagnosis present

## 2018-09-22 DIAGNOSIS — F17210 Nicotine dependence, cigarettes, uncomplicated: Secondary | ICD-10-CM | POA: Diagnosis present

## 2018-09-22 DIAGNOSIS — R4182 Altered mental status, unspecified: Secondary | ICD-10-CM

## 2018-09-22 DIAGNOSIS — I248 Other forms of acute ischemic heart disease: Secondary | ICD-10-CM | POA: Diagnosis present

## 2018-09-22 DIAGNOSIS — Y92009 Unspecified place in unspecified non-institutional (private) residence as the place of occurrence of the external cause: Secondary | ICD-10-CM

## 2018-09-22 DIAGNOSIS — T50902A Poisoning by unspecified drugs, medicaments and biological substances, intentional self-harm, initial encounter: Secondary | ICD-10-CM | POA: Diagnosis not present

## 2018-09-22 DIAGNOSIS — R945 Abnormal results of liver function studies: Secondary | ICD-10-CM

## 2018-09-22 LAB — COMPREHENSIVE METABOLIC PANEL
ALT: 2857 U/L — ABNORMAL HIGH (ref 0–44)
AST: 2521 U/L — ABNORMAL HIGH (ref 15–41)
Albumin: 4.1 g/dL (ref 3.5–5.0)
Alkaline Phosphatase: 76 U/L (ref 38–126)
Anion gap: 14 (ref 5–15)
BUN: 60 mg/dL — ABNORMAL HIGH (ref 6–20)
CHLORIDE: 107 mmol/L (ref 98–111)
CO2: 22 mmol/L (ref 22–32)
Calcium: 8.6 mg/dL — ABNORMAL LOW (ref 8.9–10.3)
Creatinine, Ser: 3.13 mg/dL — ABNORMAL HIGH (ref 0.44–1.00)
GFR calc Af Amer: 21 mL/min — ABNORMAL LOW (ref >60)
GFR calc non Af Amer: 18 mL/min — ABNORMAL LOW (ref >60)
Glucose, Bld: 115 mg/dL — ABNORMAL HIGH (ref 70–99)
Potassium: 3.7 mmol/L (ref 3.5–5.1)
Sodium: 143 mmol/L (ref 135–145)
Total Bilirubin: 0.9 mg/dL (ref 0.3–1.2)
Total Protein: 7.4 g/dL (ref 6.5–8.1)

## 2018-09-22 LAB — URINALYSIS, COMPLETE (UACMP) WITH MICROSCOPIC
Bacteria, UA: NONE SEEN
Bilirubin Urine: NEGATIVE
Glucose, UA: 50 mg/dL — AB
Ketones, ur: NEGATIVE mg/dL
Leukocytes,Ua: NEGATIVE
Nitrite: NEGATIVE
Protein, ur: 30 mg/dL — AB
Specific Gravity, Urine: 1.015 (ref 1.005–1.030)
pH: 5 (ref 5.0–8.0)

## 2018-09-22 LAB — CBC WITH DIFFERENTIAL/PLATELET
Abs Immature Granulocytes: 0.13 10*3/uL — ABNORMAL HIGH (ref 0.00–0.07)
Basophils Absolute: 0.1 10*3/uL (ref 0.0–0.1)
Basophils Relative: 0 %
EOS PCT: 0 %
Eosinophils Absolute: 0.1 10*3/uL (ref 0.0–0.5)
HEMATOCRIT: 48.4 % — AB (ref 36.0–46.0)
Hemoglobin: 15.9 g/dL — ABNORMAL HIGH (ref 12.0–15.0)
Immature Granulocytes: 1 %
Lymphocytes Relative: 6 %
Lymphs Abs: 1.1 10*3/uL (ref 0.7–4.0)
MCH: 30.9 pg (ref 26.0–34.0)
MCHC: 32.9 g/dL (ref 30.0–36.0)
MCV: 94 fL (ref 80.0–100.0)
Monocytes Absolute: 0.4 10*3/uL (ref 0.1–1.0)
Monocytes Relative: 2 %
Neutro Abs: 17.1 10*3/uL — ABNORMAL HIGH (ref 1.7–7.7)
Neutrophils Relative %: 91 %
Platelets: 190 10*3/uL (ref 150–400)
RBC: 5.15 MIL/uL — ABNORMAL HIGH (ref 3.87–5.11)
RDW: 12.8 % (ref 11.5–15.5)
Smear Review: NORMAL
WBC Morphology: INCREASED
WBC: 18.8 10*3/uL — ABNORMAL HIGH (ref 4.0–10.5)
nRBC: 0 % (ref 0.0–0.2)

## 2018-09-22 LAB — VALPROIC ACID LEVEL: Valproic Acid Lvl: 20 ug/mL — ABNORMAL LOW (ref 50.0–100.0)

## 2018-09-22 LAB — LACTIC ACID, PLASMA
LACTIC ACID, VENOUS: 3.7 mmol/L — AB (ref 0.5–1.9)
LACTIC ACID, VENOUS: 2.5 mmol/L — AB (ref 0.5–1.9)

## 2018-09-22 LAB — URINE DRUG SCREEN, QUALITATIVE (ARMC ONLY)
Amphetamines, Ur Screen: POSITIVE — AB
BENZODIAZEPINE, UR SCRN: POSITIVE — AB
Barbiturates, Ur Screen: NOT DETECTED
Cannabinoid 50 Ng, Ur ~~LOC~~: POSITIVE — AB
Cocaine Metabolite,Ur ~~LOC~~: NOT DETECTED
MDMA (Ecstasy)Ur Screen: NOT DETECTED
Methadone Scn, Ur: NOT DETECTED
Opiate, Ur Screen: POSITIVE — AB
Phencyclidine (PCP) Ur S: NOT DETECTED
TRICYCLIC, UR SCREEN: NOT DETECTED

## 2018-09-22 LAB — C DIFFICILE QUICK SCREEN W PCR REFLEX
C Diff antigen: NEGATIVE
C Diff interpretation: NOT DETECTED
C Diff toxin: NEGATIVE

## 2018-09-22 LAB — LIPASE, BLOOD: Lipase: 25 U/L (ref 11–51)

## 2018-09-22 LAB — BRAIN NATRIURETIC PEPTIDE: B Natriuretic Peptide: 889 pg/mL — ABNORMAL HIGH (ref 0.0–100.0)

## 2018-09-22 LAB — AMMONIA: Ammonia: <9 umol/L — ABNORMAL LOW (ref 9–35)

## 2018-09-22 LAB — TROPONIN I: Troponin I: 2.73 ng/mL (ref <0.03)

## 2018-09-22 LAB — ACETAMINOPHEN LEVEL

## 2018-09-22 LAB — SALICYLATE LEVEL: Salicylate Lvl: <7 mg/dL (ref 2.8–30.0)

## 2018-09-22 LAB — CK: CK TOTAL: 1679 U/L — AB (ref 38–234)

## 2018-09-22 LAB — LACTATE DEHYDROGENASE: LDH: 1827 U/L — ABNORMAL HIGH (ref 98–192)

## 2018-09-22 LAB — PREGNANCY, URINE: Preg Test, Ur: NEGATIVE

## 2018-09-22 MED ORDER — HEPARIN (PORCINE) 25000 UT/250ML-% IV SOLN
1000.0000 [IU]/h | INTRAVENOUS | Status: DC
  Administered 2018-09-22: 700 [IU]/h via INTRAVENOUS
  Filled 2018-09-22: qty 250

## 2018-09-22 MED ORDER — METRONIDAZOLE IN NACL 5-0.79 MG/ML-% IV SOLN
500.0000 mg | Freq: Once | INTRAVENOUS | Status: AC
  Administered 2018-09-22: 500 mg via INTRAVENOUS
  Filled 2018-09-22: qty 100

## 2018-09-22 MED ORDER — SODIUM CHLORIDE 0.9 % IV SOLN
1.0000 g | Freq: Once | INTRAVENOUS | Status: AC
  Administered 2018-09-22: 1 g via INTRAVENOUS
  Filled 2018-09-22: qty 10

## 2018-09-22 MED ORDER — PIPERACILLIN-TAZOBACTAM 3.375 G IVPB 30 MIN
3.3750 g | Freq: Once | INTRAVENOUS | Status: AC
  Administered 2018-09-22: 3.375 g via INTRAVENOUS
  Filled 2018-09-22: qty 50

## 2018-09-22 MED ORDER — VANCOMYCIN HCL IN DEXTROSE 1-5 GM/200ML-% IV SOLN
1000.0000 mg | Freq: Once | INTRAVENOUS | Status: AC
  Administered 2018-09-22: 1000 mg via INTRAVENOUS
  Filled 2018-09-22: qty 200

## 2018-09-22 MED ORDER — ASPIRIN 300 MG RE SUPP
300.0000 mg | Freq: Once | RECTAL | Status: AC
  Administered 2018-09-22: 300 mg via RECTAL
  Filled 2018-09-22: qty 1

## 2018-09-22 MED ORDER — METHYLPREDNISOLONE SODIUM SUCC 125 MG IJ SOLR
125.0000 mg | Freq: Once | INTRAMUSCULAR | Status: AC
  Administered 2018-09-22: 125 mg via INTRAVENOUS
  Filled 2018-09-22: qty 2

## 2018-09-22 NOTE — ED Notes (Signed)
PT  PLACED  UNDER  [facility] PAPERS  PER  DR MALINDA  INFORMED  RN  Lattie Corns AND  ODS  OFFFICER

## 2018-09-22 NOTE — ED Notes (Signed)
Patient transported to CT 

## 2018-09-22 NOTE — ED Notes (Signed)
patien to ed via ems,, found at home on floor unresponsive incontinent of b/b. Patient deconed, during decon process began to posture. MD to assess. Patient to ct, ct showing negative for bleed. Portable chest xray obtained. 2 IV's started. Blood cxs x 2 lactate and labs sent. SR on monitor vss. Will monitor.

## 2018-09-22 NOTE — ED Provider Notes (Signed)
[facility] reports not to transfer due to patient safety and her labs. Advises can not accept at present and risk of transport outweighs benefits. Spoke with Dr. Landry Corporal, MD 09/22/18 2314

## 2018-09-22 NOTE — ED Notes (Addendum)
Patient incontinent of bowels, peri care provided unassisted. Patient was able to follow commands with care. Continues to moan and grunt, but not able to verbalize needs.

## 2018-09-22 NOTE — ED Notes (Signed)
Parents left for home for the night. Want to be call with any changes and if patient is being transferred to [facility] hospital.

## 2018-09-22 NOTE — ED Notes (Signed)
heperan received from pharmacy started at ordered rate.

## 2018-09-22 NOTE — ED Notes (Signed)
ALL  PAPERWORK  FAXED  TO  [facility] 

## 2018-09-22 NOTE — ED Notes (Signed)
Patient straight cath for urine. Specimen sent to lab  

## 2018-09-22 NOTE — ED Notes (Signed)
Father's Number : Ivan Croft # [phone]

## 2018-09-22 NOTE — ED Notes (Signed)
atient [facility]

## 2018-09-22 NOTE — ED Notes (Signed)
Patient incontinent of bowel. Stool soft brown and liquidly. Patient was given peri care unassisted. Patient appears to follow commands when turned onto her side to change her linen. Patient was asked hold onto bar while her backside was cleaned, patient was able to assist with care. Patient continues to moan but not actually verbalizing needs.

## 2018-09-22 NOTE — ED Notes (Signed)
Repeat labs sent.heperan infusing
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
## 2018-09-22 NOTE — ED Notes (Signed)
Father at bedside. Dr. Katheren Shams in with parent to discuss findings, admission and plan of care.

## 2018-09-22 NOTE — ED Notes (Signed)
Critical trop 2.73

## 2018-09-22 NOTE — ED Triage Notes (Signed)
Pt presents from home via [facility] with c/o ams. Pt was found by friend unresponsive in bathroom covered in fecal matter. Friend last talked to pt yesterday.

## 2018-09-22 NOTE — ED Notes (Signed)
Friends left patients father contact information: Kylie Lam [phone]

## 2018-09-22 NOTE — ED Notes (Addendum)
Patient incontinent of stools, specimen obtained and sent to lab.

## 2018-09-23 ENCOUNTER — Inpatient Hospital Stay
Admit: 2018-09-23 | Discharge: 2018-09-23 | Disposition: A | Discharge status: Home / Self Care | Payer: No Typology Code available for payment source | Attending: Internal Medicine | Admitting: Internal Medicine

## 2018-09-23 ENCOUNTER — Emergency Department: Payer: No Typology Code available for payment source

## 2018-09-23 ENCOUNTER — Inpatient Hospital Stay: Payer: No Typology Code available for payment source

## 2018-09-23 DIAGNOSIS — E870 Hyperosmolality and hypernatremia: Secondary | ICD-10-CM | POA: Diagnosis present

## 2018-09-23 DIAGNOSIS — F4310 Post-traumatic stress disorder, unspecified: Secondary | ICD-10-CM | POA: Diagnosis present

## 2018-09-23 DIAGNOSIS — I6783 Posterior reversible encephalopathy syndrome: Secondary | ICD-10-CM | POA: Diagnosis not present

## 2018-09-23 DIAGNOSIS — J9601 Acute respiratory failure with hypoxia: Secondary | ICD-10-CM | POA: Diagnosis present

## 2018-09-23 DIAGNOSIS — Z794 Long term (current) use of insulin: Secondary | ICD-10-CM | POA: Diagnosis not present

## 2018-09-23 DIAGNOSIS — Y92009 Unspecified place in unspecified non-institutional (private) residence as the place of occurrence of the external cause: Secondary | ICD-10-CM | POA: Diagnosis not present

## 2018-09-23 DIAGNOSIS — F061 Catatonic disorder due to known physiological condition: Secondary | ICD-10-CM | POA: Diagnosis not present

## 2018-09-23 DIAGNOSIS — N179 Acute kidney failure, unspecified: Secondary | ICD-10-CM | POA: Diagnosis present

## 2018-09-23 DIAGNOSIS — R4182 Altered mental status, unspecified: Secondary | ICD-10-CM | POA: Diagnosis not present

## 2018-09-23 DIAGNOSIS — Z7982 Long term (current) use of aspirin: Secondary | ICD-10-CM | POA: Diagnosis not present

## 2018-09-23 DIAGNOSIS — Z7902 Long term (current) use of antithrombotics/antiplatelets: Secondary | ICD-10-CM | POA: Diagnosis not present

## 2018-09-23 DIAGNOSIS — R945 Abnormal results of liver function studies: Secondary | ICD-10-CM | POA: Diagnosis not present

## 2018-09-23 DIAGNOSIS — T43621A Poisoning by amphetamines, accidental (unintentional), initial encounter: Secondary | ICD-10-CM | POA: Diagnosis present

## 2018-09-23 DIAGNOSIS — T50902A Poisoning by unspecified drugs, medicaments and biological substances, intentional self-harm, initial encounter: Secondary | ICD-10-CM | POA: Diagnosis not present

## 2018-09-23 DIAGNOSIS — F17210 Nicotine dependence, cigarettes, uncomplicated: Secondary | ICD-10-CM | POA: Diagnosis present

## 2018-09-23 DIAGNOSIS — T424X1A Poisoning by benzodiazepines, accidental (unintentional), initial encounter: Secondary | ICD-10-CM | POA: Diagnosis present

## 2018-09-23 DIAGNOSIS — Z8673 Personal history of transient ischemic attack (TIA), and cerebral infarction without residual deficits: Secondary | ICD-10-CM | POA: Diagnosis not present

## 2018-09-23 DIAGNOSIS — R740 Nonspecific elevation of levels of transaminase and lactic acid dehydrogenase [LDH]: Secondary | ICD-10-CM | POA: Diagnosis present

## 2018-09-23 DIAGNOSIS — G9340 Encephalopathy, unspecified: Secondary | ICD-10-CM | POA: Diagnosis not present

## 2018-09-23 DIAGNOSIS — J690 Pneumonitis due to inhalation of food and vomit: Secondary | ICD-10-CM | POA: Diagnosis present

## 2018-09-23 DIAGNOSIS — M6282 Rhabdomyolysis: Secondary | ICD-10-CM | POA: Diagnosis present

## 2018-09-23 DIAGNOSIS — G92 Toxic encephalopathy: Secondary | ICD-10-CM | POA: Diagnosis present

## 2018-09-23 DIAGNOSIS — G936 Cerebral edema: Secondary | ICD-10-CM | POA: Diagnosis present

## 2018-09-23 DIAGNOSIS — F1910 Other psychoactive substance abuse, uncomplicated: Secondary | ICD-10-CM | POA: Diagnosis not present

## 2018-09-23 DIAGNOSIS — D689 Coagulation defect, unspecified: Secondary | ICD-10-CM | POA: Diagnosis present

## 2018-09-23 DIAGNOSIS — F419 Anxiety disorder, unspecified: Secondary | ICD-10-CM | POA: Diagnosis present

## 2018-09-23 DIAGNOSIS — Z818 Family history of other mental and behavioral disorders: Secondary | ICD-10-CM | POA: Diagnosis not present

## 2018-09-23 DIAGNOSIS — Z915 Personal history of self-harm: Secondary | ICD-10-CM | POA: Diagnosis not present

## 2018-09-23 DIAGNOSIS — K7200 Acute and subacute hepatic failure without coma: Secondary | ICD-10-CM | POA: Diagnosis present

## 2018-09-23 DIAGNOSIS — Z833 Family history of diabetes mellitus: Secondary | ICD-10-CM | POA: Diagnosis not present

## 2018-09-23 DIAGNOSIS — T40601A Poisoning by unspecified narcotics, accidental (unintentional), initial encounter: Secondary | ICD-10-CM | POA: Diagnosis present

## 2018-09-23 DIAGNOSIS — I248 Other forms of acute ischemic heart disease: Secondary | ICD-10-CM | POA: Diagnosis present

## 2018-09-23 LAB — COMPREHENSIVE METABOLIC PANEL
ALT: 2266 U/L — AB (ref 0–44)
ALT: 1584 U/L — ABNORMAL HIGH (ref 0–44)
AST: 1379 U/L — ABNORMAL HIGH (ref 15–41)
AST: 538 U/L — ABNORMAL HIGH (ref 15–41)
Albumin: 3.5 g/dL (ref 3.5–5.0)
Albumin: 3.2 g/dL — ABNORMAL LOW (ref 3.5–5.0)
Alkaline Phosphatase: 68 U/L (ref 38–126)
Alkaline Phosphatase: 55 U/L (ref 38–126)
Anion gap: 9 (ref 5–15)
Anion gap: 9 (ref 5–15)
BUN: 65 mg/dL — ABNORMAL HIGH (ref 6–20)
BUN: 60 mg/dL — ABNORMAL HIGH (ref 6–20)
CO2: 21 mmol/L — ABNORMAL LOW (ref 22–32)
CO2: 19 mmol/L — ABNORMAL LOW (ref 22–32)
CREATININE: 3.14 mg/dL — AB (ref 0.44–1.00)
Calcium: 7.7 mg/dL — ABNORMAL LOW (ref 8.9–10.3)
Calcium: 8 mg/dL — ABNORMAL LOW (ref 8.9–10.3)
Chloride: 114 mmol/L — ABNORMAL HIGH (ref 98–111)
Chloride: 120 mmol/L — ABNORMAL HIGH (ref 98–111)
Creatinine, Ser: 2.55 mg/dL — ABNORMAL HIGH (ref 0.44–1.00)
GFR calc Af Amer: 27 mL/min — ABNORMAL LOW (ref >60)
GFR calc non Af Amer: 18 mL/min — ABNORMAL LOW (ref >60)
GFR calc non Af Amer: 23 mL/min — ABNORMAL LOW (ref >60)
GFR, EST AFRICAN AMERICAN: 21 mL/min — AB (ref >60)
Glucose, Bld: 112 mg/dL — ABNORMAL HIGH (ref 70–99)
Glucose, Bld: 114 mg/dL — ABNORMAL HIGH (ref 70–99)
Potassium: 3.5 mmol/L (ref 3.5–5.1)
Potassium: 3.1 mmol/L — ABNORMAL LOW (ref 3.5–5.1)
Sodium: 144 mmol/L (ref 135–145)
Sodium: 148 mmol/L — ABNORMAL HIGH (ref 135–145)
Total Bilirubin: 0.9 mg/dL (ref 0.3–1.2)
Total Bilirubin: 1 mg/dL (ref 0.3–1.2)
Total Protein: 6 g/dL — ABNORMAL LOW (ref 6.5–8.1)
Total Protein: 5.6 g/dL — ABNORMAL LOW (ref 6.5–8.1)

## 2018-09-23 LAB — PROCALCITONIN: Procalcitonin: 21.18 ng/mL

## 2018-09-23 LAB — BRAIN NATRIURETIC PEPTIDE: B Natriuretic Peptide: 411 pg/mL — ABNORMAL HIGH (ref 0.0–100.0)

## 2018-09-23 LAB — CBC
HCT: 38.8 % (ref 36.0–46.0)
HCT: 34.8 % — ABNORMAL LOW (ref 36.0–46.0)
Hemoglobin: 12.8 g/dL (ref 12.0–15.0)
Hemoglobin: 11.6 g/dL — ABNORMAL LOW (ref 12.0–15.0)
MCH: 30.3 pg (ref 26.0–34.0)
MCH: 31 pg (ref 26.0–34.0)
MCHC: 33 g/dL (ref 30.0–36.0)
MCHC: 33.3 g/dL (ref 30.0–36.0)
MCV: 91.9 fL (ref 80.0–100.0)
MCV: 93 fL (ref 80.0–100.0)
NRBC: 0 % (ref 0.0–0.2)
Platelets: 133 10*3/uL — ABNORMAL LOW (ref 150–400)
Platelets: 114 10*3/uL — ABNORMAL LOW (ref 150–400)
RBC: 4.22 MIL/uL (ref 3.87–5.11)
RBC: 3.74 MIL/uL — ABNORMAL LOW (ref 3.87–5.11)
RDW: 12.8 % (ref 11.5–15.5)
RDW: 13.1 % (ref 11.5–15.5)
WBC: 13.8 10*3/uL — ABNORMAL HIGH (ref 4.0–10.5)
WBC: 8.6 10*3/uL (ref 4.0–10.5)
nRBC: 0 % (ref 0.0–0.2)

## 2018-09-23 LAB — PHOSPHORUS
Phosphorus: 5.1 mg/dL — ABNORMAL HIGH (ref 2.5–4.6)
Phosphorus: 4.3 mg/dL (ref 2.5–4.6)

## 2018-09-23 LAB — BLOOD GAS, ARTERIAL
Acid-base deficit: 6.9 mmol/L — ABNORMAL HIGH (ref 0.0–2.0)
Acid-base deficit: 5.4 mmol/L — ABNORMAL HIGH (ref 0.0–2.0)
Bicarbonate: 18.5 mmol/L — ABNORMAL LOW (ref 20.0–28.0)
Bicarbonate: 19.2 mmol/L — ABNORMAL LOW (ref 20.0–28.0)
FIO2: 0.3
FIO2: 0.3
MECHVT: 450 mL
MECHVT: 450 mL
O2 Saturation: 96.5 %
O2 Saturation: 96.9 %
PATIENT TEMPERATURE: 37
PCO2 ART: 34 mmHg (ref 32.0–48.0)
PEEP: 5 cmH2O
PEEP: 5 cmH2O
Patient temperature: 37
RATE: 16 resp/min
RATE: 16 resp/min
pCO2 arterial: 36 mmHg (ref 32.0–48.0)
pH, Arterial: 7.32 — ABNORMAL LOW (ref 7.350–7.450)
pH, Arterial: 7.36 (ref 7.350–7.450)
pO2, Arterial: 93 mmHg (ref 83.0–108.0)
pO2, Arterial: 93 mmHg (ref 83.0–108.0)

## 2018-09-23 LAB — MRSA PCR SCREENING: MRSA by PCR: NEGATIVE

## 2018-09-23 LAB — BLOOD GAS, VENOUS
Acid-base deficit: 5.2 mmol/L — ABNORMAL HIGH (ref 0.0–2.0)
Bicarbonate: 20 mmol/L (ref 20.0–28.0)
O2 Saturation: 40.6 %
Patient temperature: 37
pCO2, Ven: 37 mmHg — ABNORMAL LOW (ref 44.0–60.0)
pH, Ven: 7.34 (ref 7.250–7.430)
pO2, Ven: <31 mmHg — CL (ref 32.0–45.0)

## 2018-09-23 LAB — MAGNESIUM
MAGNESIUM: 1.8 mg/dL (ref 1.7–2.4)
Magnesium: 2.2 mg/dL (ref 1.7–2.4)

## 2018-09-23 LAB — INFLUENZA PANEL BY PCR (TYPE A & B)
Influenza A By PCR: NEGATIVE
Influenza B By PCR: NEGATIVE

## 2018-09-23 LAB — CK: CK TOTAL: 1635 U/L — AB (ref 38–234)

## 2018-09-23 LAB — TRIGLYCERIDES: Triglycerides: 204 mg/dL — ABNORMAL HIGH (ref <150)

## 2018-09-23 LAB — PROTIME-INR
INR: 1.89
Prothrombin Time: 21.5 seconds — ABNORMAL HIGH (ref 11.4–15.2)

## 2018-09-23 LAB — HEPARIN LEVEL (UNFRACTIONATED)
Heparin Unfractionated: <0.1 IU/mL — ABNORMAL LOW (ref 0.30–0.70)
Heparin Unfractionated: <0.1 IU/mL — ABNORMAL LOW (ref 0.30–0.70)

## 2018-09-23 LAB — TROPONIN I
Troponin I: 1.82 ng/mL (ref <0.03)
Troponin I: 1.37 ng/mL (ref <0.03)
Troponin I: 0.98 ng/mL (ref <0.03)
Troponin I: 0.88 ng/mL (ref <0.03)

## 2018-09-23 LAB — ACETAMINOPHEN LEVEL: Acetaminophen (Tylenol), Serum: <10 ug/mL — ABNORMAL LOW (ref 10–30)

## 2018-09-23 MED ORDER — ORAL CARE MOUTH RINSE
15.0000 mL | OROMUCOSAL | Status: DC
  Administered 2018-09-23 – 2018-09-24 (×13): 15 mL via OROMUCOSAL

## 2018-09-23 MED ORDER — PIPERACILLIN-TAZOBACTAM 3.375 G IVPB: 3.3750 g | Freq: Three times a day (TID) | INTRAVENOUS | Status: DC

## 2018-09-23 MED ORDER — CHLORHEXIDINE GLUCONATE 0.12% ORAL RINSE (MEDLINE KIT)
15.0000 mL | Freq: Two times a day (BID) | OROMUCOSAL | Status: DC
  Administered 2018-09-23 – 2018-09-24 (×3): 15 mL via OROMUCOSAL

## 2018-09-23 MED ORDER — MIDAZOLAM HCL 2 MG/2ML IJ SOLN
INTRAMUSCULAR | Status: AC
  Administered 2018-09-23: 2 mg via INTRAVENOUS
  Filled 2018-09-23: qty 2

## 2018-09-23 MED ORDER — SODIUM BICARBONATE 8.4 % IV SOLN
150.0000 meq | Freq: Once | INTRAVENOUS | Status: AC
  Administered 2018-09-23: 150 meq via INTRAVENOUS
  Filled 2018-09-23: qty 150

## 2018-09-23 MED ORDER — MIDAZOLAM HCL 2 MG/2ML IJ SOLN
2.0000 mg | INTRAMUSCULAR | Status: DC | PRN
  Administered 2018-09-23 – 2018-09-24 (×3): 2 mg via INTRAVENOUS
  Filled 2018-09-23 (×2): qty 2

## 2018-09-23 MED ORDER — SODIUM CHLORIDE 0.9 % IV SOLN
500.0000 mg | INTRAVENOUS | Status: DC
  Administered 2018-09-24 – 2018-09-25 (×2): 500 mg via INTRAVENOUS
  Filled 2018-09-23 (×3): qty 500

## 2018-09-23 MED ORDER — PANTOPRAZOLE SODIUM 40 MG IV SOLR
40.0000 mg | INTRAVENOUS | Status: DC
  Administered 2018-09-24 – 2018-09-25 (×2): 40 mg via INTRAVENOUS
  Filled 2018-09-23 (×2): qty 40

## 2018-09-23 MED ORDER — SODIUM CHLORIDE 0.9 % IV SOLN
INTRAVENOUS | Status: DC
  Administered 2018-09-24: via INTRAVENOUS

## 2018-09-23 MED ORDER — IPRATROPIUM-ALBUTEROL 0.5-2.5 (3) MG/3ML IN SOLN
3.0000 mL | Freq: Four times a day (QID) | RESPIRATORY_TRACT | Status: DC
  Administered 2018-09-23 – 2018-09-25 (×9): 3 mL via RESPIRATORY_TRACT
  Filled 2018-09-23 (×9): qty 3

## 2018-09-23 MED ORDER — SUCCINYLCHOLINE CHLORIDE 20 MG/ML IJ SOLN
120.0000 mg | Freq: Once | INTRAMUSCULAR | Status: AC
  Administered 2018-09-23: 120 mg via INTRAVENOUS

## 2018-09-23 MED ORDER — PIPERACILLIN-TAZOBACTAM 3.375 G IVPB
3.3750 g | Freq: Three times a day (TID) | INTRAVENOUS | Status: DC
  Administered 2018-09-23 – 2018-09-25 (×7): 3.375 g via INTRAVENOUS
  Filled 2018-09-23 (×7): qty 50

## 2018-09-23 MED ORDER — ETOMIDATE 2 MG/ML IV SOLN
20.0000 mg | Freq: Once | INTRAVENOUS | Status: AC
  Administered 2018-09-23: 20 mg via INTRAVENOUS

## 2018-09-23 MED ORDER — SODIUM CHLORIDE 0.9 % IV SOLN
500.0000 mg | Freq: Once | INTRAVENOUS | Status: AC
  Administered 2018-09-23: 500 mg via INTRAVENOUS
  Filled 2018-09-23: qty 500

## 2018-09-23 MED ORDER — HEPARIN SODIUM (PORCINE) 5000 UNIT/ML IJ SOLN
5000.0000 [IU] | Freq: Three times a day (TID) | INTRAMUSCULAR | Status: DC
  Administered 2018-09-23 – 2018-09-25 (×6): 5000 [IU] via SUBCUTANEOUS
  Filled 2018-09-23 (×6): qty 1

## 2018-09-23 MED ORDER — LORAZEPAM 2 MG/ML IJ SOLN
1.0000 mg | Freq: Once | INTRAMUSCULAR | Status: AC
  Administered 2018-09-23: 1 mg via INTRAVENOUS

## 2018-09-23 MED ORDER — PROPOFOL 10 MG/ML IV BOLUS: 0.5000 mg/kg | Freq: Once | INTRAVENOUS | Status: DC

## 2018-09-23 MED ORDER — PROPOFOL 1000 MG/100ML IV EMUL
5.0000 ug/kg/min | INTRAVENOUS | Status: DC
  Administered 2018-09-23: 80 ug/kg/min via INTRAVENOUS
  Administered 2018-09-23 (×3): 55 ug/kg/min via INTRAVENOUS
  Administered 2018-09-23: 50 ug/kg/min via INTRAVENOUS
  Administered 2018-09-24 (×3): 80 ug/kg/min via INTRAVENOUS
  Administered 2018-09-24: 60 ug/kg/min via INTRAVENOUS
  Filled 2018-09-23 (×11): qty 100

## 2018-09-23 MED ORDER — LORAZEPAM 2 MG/ML IJ SOLN
INTRAMUSCULAR | Status: AC
  Administered 2018-09-23: 1 mg via INTRAVENOUS
  Filled 2018-09-23: qty 1

## 2018-09-23 MED ORDER — HEPARIN BOLUS VIA INFUSION
2000.0000 [IU] | Freq: Once | INTRAVENOUS | Status: AC
  Administered 2018-09-23: 2000 [IU] via INTRAVENOUS
  Filled 2018-09-23: qty 2000
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
## 2018-09-23 NOTE — ED Provider Notes (Signed)
I was notified by the nursing staff at 4:30 AM that the patient has been agitated attempting to crawl out of the bed.  On my arrival to the room patient with considerable agitation nonverbal not responding to verbal stimuli following commands.  Patient had received Ativan 2 mg as ordered by the hospitalist however patient remains the same.  Given delirium with potential for self-harm and inability to protect airway patient was intubated and sedated. .Critical Care Performed by: Darci Current, MD Authorized by: Darci Current, MD   Critical care provider statement:    Critical care time (minutes):  30   Critical care time was exclusive of:  Separately billable procedures and treating other patients   Critical care was necessary to treat or prevent imminent or life-threatening deterioration of the following conditions:  Toxidrome and respiratory failure   Critical care was time spent personally by me on the following activities:  Development of treatment plan with patient or surrogate, discussions with consultants, evaluation of patient's response to treatment, examination of patient, obtaining history from patient or surrogate, ordering and performing treatments and interventions, ordering and review of laboratory studies, ordering and review of radiographic studies, pulse oximetry, re-evaluation of patient's condition and review of old charts   I assumed direction of critical care for this patient from another provider in my specialty: no      Dr. Allena Katz was notified of the patient's clinical change and intubation.   Darci Current, MD 09/23/18 [phone]
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
## 2018-09-23 NOTE — ED Provider Notes (Signed)
Discussed with Dr. Norma Fredrickson; advises with Tbili normal, ammonia, this does not appear to be liver failure but likely shock liver instead. Continue to monitor liver function but NO indication for transfer to liver transplant center at this time.   Discussed admission with Dr. Allena Katz (hospitalist) who will also consult pulm critical care team.   Patient admitted to medical service with anticipation of CCM consultation and close monitoring.    Sharyn Creamer, MD 09/23/18 [phone]

## 2018-09-23 NOTE — Progress Notes (Signed)
None of patient's medicines were controlled substances- notified pharmacy.  I was told that I did not have to count all the medicines- I was told only to write down each medication.  Family had already left and when I went to drop the medicines off to pharmacy, another pharmacist told me that all the medications had to be counted in front of patient and/or family.  I relayed the fact that the patient is sedated on the ventilator and the family had already left.  Pharmacy took the medicines anyway.

## 2018-09-23 NOTE — Progress Notes (Signed)
Pharmacy Antibiotic Note  Kylie Lam is a 38 y.o. female admitted on 09/22/2018 with sepsis.  Pharmacy has been consulted for zosyn dosing.  Plan: Zosyn 3.375g IV q8h (4 hour infusion).  Weight: 160 lb 0.9 oz (72.6 kg)  Temp (24hrs), Avg:99 F (37.2 C), Min:98.9 F (37.2 C), Max:99.2 F (37.3 C)  Recent Labs  Lab 09/22/18 1526 09/22/18 2000 09/23/18 0202  WBC 18.8*  --   --   CREATININE 3.13*  --  3.14*  LATICACIDVEN 3.7* 2.5*  --     Estimated Creatinine Clearance: 24.3 mL/min (A) (by C-G formula based on SCr of 3.14 mg/dL (H)).    Allergies  Allergen Reactions  . Baclofen Other (See Comments)    Dizzy, falling, can't control actions  . Haldol [Haloperidol Lactate] Other (See Comments)    Stiffness  . Prozac [Fluoxetine Hcl]     Redness all over body.    Thank you for allowing pharmacy to be a part of this patient's care.  Thomasene Ripple, PharmD, BCPS Clinical Pharmacist 09/23/2018

## 2018-09-23 NOTE — ED Notes (Signed)
US at bedside
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
## 2018-09-23 NOTE — ED Notes (Signed)
Pt at [place], blood and ABX start att

## 2018-09-23 NOTE — ED Notes (Signed)
Call for report after bed assigned, per charge ICU: can't accept any pt's until 2/15 day shift previously coordinated with ED charge Clint Lipps, RN (herewith [phone])
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
## 2018-09-23 NOTE — Progress Notes (Signed)
[employer] [facility] - [place] at [facility]   PATIENT NAME: Tani Grays    MR#:  [id number]  DATE OF BIRTH:  06/26/1981  SUBJECTIVE:  CHIEF COMPLAINT:   Chief Complaint  Patient presents with  . Altered Mental Status   On full ventilatory support with 30% oxygen. On sedation.  REVIEW OF SYSTEMS:    Review of Systems  Unable to perform ROS: Intubated   DRUG ALLERGIES:   Allergies  Allergen Reactions  . Baclofen Other (See Comments)    Dizzy, falling, can't control actions  . Haldol [Haloperidol Lactate] Other (See Comments)    Stiffness  . Prozac [Fluoxetine Hcl]     Redness all over body.    VITALS:  Blood pressure (!) 151/95, pulse 73, temperature 98.2 F (36.8 C), temperature source Oral, resp. rate 20, weight 72.6 kg, SpO2 100 %.  PHYSICAL EXAMINATION:   Physical Exam  GENERAL:  38 y.o.-year-old patient lying in the bed .  On ventilator.  Critically ill. EYES: Pupils equal, round, reactive to light and accommodation. No scleral icterus. Extraocular muscles intact.  HEENT: Head atraumatic, normocephalic. Oropharynx and nasopharynx clear.  ET tube in place NECK:  Supple, no jugular venous distention. No thyroid enlargement, no tenderness.  LUNGS: Normal breath sounds bilaterally, no wheezing, rales, rhonchi. No use of accessory muscles of respiration.  CARDIOVASCULAR: S1, S2 normal. No murmurs, rubs, or gallops.  ABDOMEN: Soft, nontender, nondistended. Bowel sounds present. No organomegaly or mass.  EXTREMITIES: No cyanosis, clubbing or edema b/l.    NEUROLOGIC: Cranial nerves II through XII are intact. No focal Motor or sensory deficits b/l.   PSYCHIATRIC: The patient is sedated SKIN: No obvious rash, lesion, or ulcer.   LABORATORY PANEL:   CBC Recent Labs  Lab 09/23/18 0409  WBC 13.8*  HGB 12.8  HCT 38.8  PLT 133*   ------------------------------------------------------------------------------------------------------------------ Chemistries   Recent Labs  Lab 09/23/18 0202  NA 144  K 3.5  CL 114*  CO2 21*  GLUCOSE 112*  BUN 65*  CREATININE 3.14*  CALCIUM 7.7*  MG 1.8  AST 1,379*  ALT 2,266*  ALKPHOS 68  BILITOT 0.9   ------------------------------------------------------------------------------------------------------------------  Cardiac Enzymes Recent Labs  Lab 09/23/18 0822  TROPONINI 1.37*   ------------------------------------------------------------------------------------------------------------------  RADIOLOGY:  Ct Abdomen Pelvis Wo Contrast  Result Date: 09/22/2018 CLINICAL DATA:  Abdominal distension. Elevated white blood cell count and liver functions. Polysubstance abuse. Elevated creatinine. EXAM: CT ABDOMEN AND PELVIS WITHOUT CONTRAST TECHNIQUE: Multidetector CT imaging of the abdomen and pelvis was performed following the standard protocol without IV contrast. COMPARISON:  July 29, 2018 FINDINGS: Lower chest: Mild patchy infiltrate in the left lung base. There is air adjacent to the heart and mediastinum as seen on series 3, image 8 and images 6 and 7. This region is incompletely evaluated due to motion. Hepatobiliary: No focal liver abnormality is seen. Status post cholecystectomy. No biliary dilatation. Pancreas: Unremarkable. No pancreatic ductal dilatation or surrounding inflammatory changes. Spleen: Normal in size without focal abnormality. Adrenals/Urinary Tract: Adrenal glands are normal. No renal stones, masses, or hydronephrosis. The ureters are nondilated with no stones identified. The bladder is unremarkable. Stomach/Bowel: The stomach and small bowel are normal. The colon is unremarkable. The appendix is normal. Vascular/Lymphatic: Air in the proximal left femoral vein and external iliac vein and a branching vessel are likely from a left-sided injection or IV. No other abnormalities. Reproductive: Status post hysterectomy. No adnexal masses. Other: No abdominal wall hernia or abnormality. No  abdominopelvic ascites.  Musculoskeletal: No acute or significant osseous findings. IMPRESSION: 1. There appears to be air tracking along the superior vena cava and adjacent to the heart. This region is poorly evaluated due to motion and the finding could be artifactual. Recommend a dedicated CT scan of the chest for further evaluation. 2. Mild patchy infiltrate in left base worrisome for pneumonia or aspiration. 3. Air in the proximal left femoral vein, external iliac vein, and a branching vessel are likely due to a left-sided IV or injection. Recommend clinical correlation. Electronically Signed   By: Gerome Samavid  Williams III M.D   On: 09/22/2018 18:47   Ct Head Wo Contrast  Result Date: 09/22/2018 CLINICAL DATA:  Altered mental status. Found unresponsive in bathroom. EXAM: CT HEAD WITHOUT CONTRAST CT CERVICAL SPINE WITHOUT CONTRAST TECHNIQUE: Multidetector CT imaging of the head and cervical spine was performed following the standard protocol without intravenous contrast. Multiplanar CT image reconstructions of the cervical spine were also generated. COMPARISON:  None. FINDINGS: CT HEAD FINDINGS Brain: There is no evidence of acute intracranial hemorrhage, mass lesion, brain edema or extra-axial fluid collection. The ventricles and subarachnoid spaces are appropriately sized for age. There is no CT evidence of acute cortical infarction. Vascular:  No hyperdense vessel identified. Skull: Negative for fracture or focal lesion. Sinuses/Orbits: The visualized paranasal sinuses and mastoid air cells are clear. No orbital abnormalities are seen. Other: None. CT CERVICAL SPINE FINDINGS Alignment: Near anatomic. There is mild straightening and a minimal convex left scoliosis. Skull base and vertebrae: No evidence of acute fracture or traumatic subluxation. Soft tissues and spinal canal: No prevertebral fluid or swelling. No visible canal hematoma. Disc levels: Mild spondylosis with scattered uncinate spurring. No large  disc herniation or high-grade spinal stenosis identified. Upper chest: Unremarkable. Other: None. IMPRESSION: 1. Normal head CT without acute intracranial findings. 2. No evidence of acute cervical spine fracture, traumatic subluxation or static signs of instability. Mild cervical spondylosis. Electronically Signed   By: Carey BullocksWilliam  Veazey M.D.   On: 09/22/2018 15:31   Ct Cervical Spine Wo Contrast  Result Date: 09/22/2018 CLINICAL DATA:  Altered mental status. Found unresponsive in bathroom. EXAM: CT HEAD WITHOUT CONTRAST CT CERVICAL SPINE WITHOUT CONTRAST TECHNIQUE: Multidetector CT imaging of the head and cervical spine was performed following the standard protocol without intravenous contrast. Multiplanar CT image reconstructions of the cervical spine were also generated. COMPARISON:  None. FINDINGS: CT HEAD FINDINGS Brain: There is no evidence of acute intracranial hemorrhage, mass lesion, brain edema or extra-axial fluid collection. The ventricles and subarachnoid spaces are appropriately sized for age. There is no CT evidence of acute cortical infarction. Vascular:  No hyperdense vessel identified. Skull: Negative for fracture or focal lesion. Sinuses/Orbits: The visualized paranasal sinuses and mastoid air cells are clear. No orbital abnormalities are seen. Other: None. CT CERVICAL SPINE FINDINGS Alignment: Near anatomic. There is mild straightening and a minimal convex left scoliosis. Skull base and vertebrae: No evidence of acute fracture or traumatic subluxation. Soft tissues and spinal canal: No prevertebral fluid or swelling. No visible canal hematoma. Disc levels: Mild spondylosis with scattered uncinate spurring. No large disc herniation or high-grade spinal stenosis identified. Upper chest: Unremarkable. Other: None. IMPRESSION: 1. Normal head CT without acute intracranial findings. 2. No evidence of acute cervical spine fracture, traumatic subluxation or static signs of instability. Mild cervical  spondylosis. Electronically Signed   By: Carey BullocksWilliam  Veazey M.D.   On: 09/22/2018 15:31   Dg Chest Portable 1 View  Result Date: 09/23/2018 CLINICAL DATA:  Endotracheal placement. Altered mental status. Unresponsive. EXAM: PORTABLE CHEST 1 VIEW COMPARISON:  09/22/2018 FINDINGS: Endotracheal tube is 3.5 cm above the carina. Nasogastric tube enters the stomach. Heart size is normal. There may be minimal atelectasis in the left lower lobe. Lungs are otherwise clear. IMPRESSION: Endotracheal tube and nasogastric tube well positioned. Lungs clear except for mild atelectasis in the left lower lobe. Electronically Signed   By: Paulina Fusi M.D.   On: 09/23/2018 06:55   Dg Chest Portable 1 View  Result Date: 09/22/2018 CLINICAL DATA:  Altered mental status/unresponsive EXAM: PORTABLE CHEST 1 VIEW COMPARISON:  July 03, 2017 FINDINGS: No edema or consolidation. The heart size and pulmonary vascularity are normal. No adenopathy. No pneumothorax. There is an old healed fracture the lateral right fifth rib. IMPRESSION: No edema or consolidation. Electronically Signed   By: Bretta Bang III M.D.   On: 09/22/2018 16:09   US Liver Doppler  Result Date: 09/23/2018 CLINICAL DATA:  Transaminitis. EXAM: DUPLEX ULTRASOUND OF LIVER TECHNIQUE: Color and duplex Doppler ultrasound was performed to evaluate the hepatic in-flow and out-flow vessels. COMPARISON:  CT 09/22/2018 FINDINGS: Liver: Normal parenchymal echogenicity. Normal hepatic contour without nodularity. No focal lesion, mass or intrahepatic biliary ductal dilatation. Main Portal Vein size: 1.2 cm Portal Vein Velocities Main Prox:  45.4 cm/sec Main Mid: 39.8 cm/sec Main Dist:  46.6 cm/sec Right: 32.2 cm/sec Left: 37.3 cm/sec Hepatic Vein Velocities Right:  15.8 cm/sec Middle:  41.6 cm/sec Left:  20.7 cm/sec IVC: Present and patent with normal respiratory phasicity. Hepatic Artery Velocity:  82.3 cm/sec Splenic Vein Velocity:  30.8 cm/sec Spleen: 8.1 cm x 3.7 cm  x 10.2 cm with a total volume of 160 cm^3 (411 cm^3 is upper limit normal) Portal Vein Occlusion/Thrombus: No Splenic Vein Occlusion/Thrombus: No Ascites: None Varices: None IMPRESSION: Normal Doppler evaluation of the liver. No evidence of portal hypertension. Electronically Signed   By: Elberta Fortis M.D.   On: 09/23/2018 02:48   ASSESSMENT AND PLAN:   * Vent dependent resp failure Continue full ventilatory support. Wean sedation.  * Acute encephalopathy Likely toxic but etiology unclear  * Aspiration with no infiltrates on chest xray. On abx emperically  * Shock liver Follow enzymes. Needs RUQ [place]  * AKI : IV hydration. Hold nephrotoxic agents.  * Elevated troponin: Demand ischemia versus NSTEMI.  Patient has received aspirin.  Patient started on heparin drip.  Cardiology consult requested on admission.  Echocardiogram. Monitor on telemetry.  *  Rhabdomyolysis IVF Monitor CK levels and BUN/Cr  DVT prophylaxis: On heparin drip  GI prophylaxis: IV PPI  All the records are reviewed and case discussed with Care Management/Social Worker Management plans discussed with the patient, family and they are in agreement.  CODE STATUS: FULL CODE  DVT Prophylaxis: SCDs  TOTAL CRITICAL CARE TIME TAKING CARE OF THIS PATIENT: 35 minutes.   POSSIBLE D/C IN 3-4 DAYS, DEPENDING ON CLINICAL CONDITION.  Molinda Bailiff Casimiro Lienhard M.D on 09/23/2018 at 9:28 AM  Between 7am to 6pm - Pager - [phone]  After 6pm go to [email or web address] - password [email or web address]  [facility] [place] [facility]  Office  [phone]  CC: Primary care physician; System, Pcp Not In  Note: This dictation was prepared with Dragon dictation along with smaller phrase technology. Any transcriptional errors that result from this process are unintentional.

## 2018-09-23 NOTE — ED Notes (Addendum)
Multiple calls to sig other, Harrold Donath, and to pt's listed cell phone, after 3 rings goes to busy signal, possible hang-up?   Admitting Dr Allena Katz notified

## 2018-09-23 NOTE — H&P (Signed)
Addendum to H&P  Pt was very restless and unsure that she is protective of her airway. ER doctor evaluated and recommended intubation. Pt was intubated and sedated.

## 2018-09-23 NOTE — Progress Notes (Signed)
Patient's family came by (father, sister and father's significant other) came from patient's apartment and dropped off patient's medications that she had at her apartment.  Sister also stated that patient is on heroin, morphine, ativan, valium and adderall that they found at her apartment.  They stated they notified the police about this information.  They also brought patient's purse but the father decided to keep the purse. Will document patient's medicines and bring to pharmacy.

## 2018-09-23 NOTE — ED Notes (Signed)
VBG taken by Brett Albino,

## 2018-09-24 LAB — HIV ANTIBODY (ROUTINE TESTING W REFLEX): HIV SCREEN 4TH GENERATION: NONREACTIVE

## 2018-09-24 LAB — ECHOCARDIOGRAM COMPLETE: Weight: 2560.86 oz

## 2018-09-24 NOTE — Progress Notes (Signed)
Initial Nutrition Assessment  DOCUMENTATION CODES:   Not applicable  INTERVENTION:  If patient unable to extubate today, recommend initiating Vital High Protein at 20 mL/hr (480 mL goal daily volume) + Pro-Stat 30 mL TID per OGT. Provides 780 kcal, 87 grams of protein, 403 mL H2O daily. With propofol rate provides 1675 kcal daily.  If tube feeds are initiated provide liquid MVI daily per tube and a minimum free water flush of 30 mL Q4hrs to maintain tube patency.  NUTRITION DIAGNOSIS:   Inadequate oral intake related to inability to eat as evidenced by NPO status.  GOAL:   Provide needs based on ASPEN/SCCM guidelines  MONITOR:   Vent status, Labs, Weight trends, TF tolerance, I & O's  REASON FOR ASSESSMENT:   Ventilator    ASSESSMENT:   38 year old female with PMHx of anxiety, depression, sleep disorder, IBS, GERD, hx TIA, PTSD, asthma admitted with acute hypoxic respiratory failure secondary to unintentional overdose requiring intubation on 2/15, rhabdomyolysis, acute renal failure.   Patient intubated and sedated. On PRVC mode with FiO2 30% and PEEP 5 cmH2O. Abdomen soft. Patient having type 7 BMs with rectal tube in place.  Enteral Access: 18 Fr. OGT placed 2/15; terminates in stomach per chest x-ray 2/15; 55 cm at corner of mouth  MAP: 85-105 mmHg  Patient is currently intubated on ventilator support Ve: 8.3 L/min Temp (24hrs), Avg:98.3 F (36.8 C), Min:97.9 F (36.6 C), Max:98.7 F (37.1 C)  Propofol: 33.9 mL/hr (895 kcal daily)  Medications reviewed and include: pantoprazole, azithromycin, Zosyn, propofol gtt.  Labs reviewed: Sodium 148, Potassium 3.1, Chloride 120, CO2 19, BUN 60, Creatinine 2.55. Phosphorus and Magnesium WNL.  I/O: 1550 mL UOP yesterday (0.9 mL/kg/hr)  Patient does not meet criteria for malnutrition at this time.  Discussed with RN and MD.  NUTRITION - FOCUSED PHYSICAL EXAM:    Most Recent Value  Orbital Region  No depletion  Upper  Arm Region  No depletion  Thoracic and Lumbar Region  No depletion  Buccal Region  Unable to assess  Temple Region  No depletion  Clavicle Bone Region  No depletion  Clavicle and Acromion Bone Region  No depletion  Scapular Bone Region  Unable to assess  Dorsal Hand  No depletion  Patellar Region  No depletion  Anterior Thigh Region  No depletion  Posterior Calf Region  No depletion  Edema (RD Assessment)  None  Hair  Reviewed  Eyes  Unable to assess  Mouth  Unable to assess  Skin  Reviewed  Nails  Reviewed     Diet Order:   Diet Order            Diet NPO time specified  Diet effective now             EDUCATION NEEDS:   No education needs have been identified at this time  Skin:  Skin Assessment: Reviewed RN Assessment  Last BM:  09/24/2018 - medium type 7 in rectal tube  Height:   Ht Readings from Last 1 Encounters:  09/24/18 5\' 9"  (1.753 m)   Weight:   Wt Readings from Last 1 Encounters:  09/24/18 71.8 kg   Ideal Body Weight:  65.9 kg  BMI:  Body mass index is 23.38 kg/m.  Estimated Nutritional Needs:   Kcal:  1643 (PSU 2003b w/ MSJ 1460, Ve 8.3, Tmax 37.1)  Protein:  86-108 grams (1.2-1.5 grams/kg)  Fluid:  2.1-2.5 L/day (30-35 mL/kg)  Helane Rima, MS, RD, LDN Office:  [phone] Pager: [phone] After Hours/Weekend Pager: [phone] Cell: [phone]

## 2018-09-24 NOTE — Progress Notes (Signed)
Pt was suctioned prior to extubation for a small amount of thick tan secretions. Per Dr. Georgann Housekeeper order, she was extubated without incident. She was placed on a 2 L nasal cannula and Sa02 are 94%.
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
## 2018-09-24 NOTE — Progress Notes (Signed)
Pt extubated to nasal cannula with RT at bedside. Pts o2 sats WNL at this time. Sitter at bedside. WIll continue to monitor.

## 2018-09-24 NOTE — Progress Notes (Addendum)
Follow up - Critical Care Medicine Note  Patient Details:    Kylie Lam is an 38 y.o. female.  Has issues with PTSD and polysubstance abuse.  Has had numerous episodes of overdosing intentional and unintentional.  Was found down at home with  respiratory failure and drug paraphernalia in the home.  Currently intubated and mechanically ventilated.   Lines, Airways, Drains: Airway 7 mm (Active)  Secured at (cm) 23 cm 09/24/2018  4:10 PM  Measured From Lips 09/24/2018  4:10 PM  Secured Location Center 09/24/2018  4:10 PM  Secured By [employer] T[employer] Holder 09/24/2018  4:10 PM  Tube Holder Repositioned Yes 09/24/2018  4:10 PM  Cuff Pressure (cm H2O) 22 cm H2O 09/24/2018  8:08 AM  Site Condition Dry 09/24/2018  4:10 PM     Urethral Catheter Judeth CornfieldStephanie, EDT Straight-tip 16 Fr. (Active)  Indication for Insertion or Continuance of Catheter Unstable critical patients (first 24-48 hours) 09/24/2018  8:02 PM  Site Assessment Clean;Intact 09/24/2018  8:02 PM  Catheter Maintenance Bag below level of bladder 09/24/2018  8:02 PM  Collection Container Standard drainage bag 09/24/2018  8:02 PM  Securement Method Securing device (Describe) 09/24/2018  8:02 PM  Urinary Catheter Interventions Unclamped 09/24/2018  8:02 PM  Output (mL) 200 mL 09/24/2018 10:34 PM    Anti-infectives:  Anti-infectives (From admission, onward)   Start     Dose/Rate Route Frequency Ordered Stop   09/24/18 0200  azithromycin (ZITHROMAX) 500 mg in sodium chloride 0.9 % 250 mL IVPB     500 mg 250 mL/hr over 60 Minutes Intravenous Every 24 hours 09/23/18 0232     09/23/18 0245  piperacillin-tazobactam (ZOSYN) IVPB 3.375 g  Status:  Discontinued     3.375 g 12.5 mL/hr over 240 Minutes Intravenous Every 8 hours 09/23/18 0243 09/23/18 0243   09/23/18 0245  piperacillin-tazobactam (ZOSYN) IVPB 3.375 g     3.375 g 12.5 mL/hr over 240 Minutes Intravenous Every 8 hours 09/23/18 0243     09/23/18 0100  azithromycin (ZITHROMAX) 500 mg in sodium  chloride 0.9 % 250 mL IVPB     500 mg 250 mL/hr over 60 Minutes Intravenous  Once 09/23/18 0046 09/23/18 0320   09/22/18 2230  metroNIDAZOLE (FLAGYL) IVPB 500 mg     500 mg 100 mL/hr over 60 Minutes Intravenous  Once 09/22/18 2216 09/22/18 2345   09/22/18 1645  cefTRIAXone (ROCEPHIN) 1 g in sodium chloride 0.9 % 100 mL IVPB     1 g 200 mL/hr over 30 Minutes Intravenous  Once 09/22/18 1641 09/22/18 1932   09/22/18 1645  vancomycin (VANCOCIN) IVPB 1000 mg/200 mL premix     1,000 mg 200 mL/hr over 60 Minutes Intravenous  Once 09/22/18 1641 09/22/18 1858   09/22/18 1645  piperacillin-tazobactam (ZOSYN) IVPB 3.375 g     3.375 g 100 mL/hr over 30 Minutes Intravenous  Once 09/22/18 1643 09/22/18 1739      Microbiology: Results for orders placed or performed during the hospital encounter of 09/22/18  C difficile quick scan w PCR reflex     Status: None   Collection Time: 09/22/18  8:54 PM  Result Value Ref Range Status   C Diff antigen NEGATIVE NEGATIVE Final   C Diff toxin NEGATIVE NEGATIVE Final   C Diff interpretation No C. difficile detected.  Final    Comment: Performed at [facility], [address] [place], [place] [address]  MRSA PCR Screening     Status: None   Collection Time:  09/23/18  8:26 AM  Result Value Ref Range Status   MRSA by PCR NEGATIVE NEGATIVE Final    Comment:        The GeneXpert MRSA Assay (FDA approved for NASAL specimens only), is one component of a comprehensive MRSA colonization surveillance program. It is not intended to diagnose MRSA infection nor to guide or monitor treatment for MRSA infections. Performed at [facility], [address] [place], [place] [address]     Best Practice/Protocols:  VTE Prophylaxis: Heparin (SQ) GI Prophylaxis: Proton Pump Inhibitor Continous Sedation  Events:   Studies: Ct Abdomen Pelvis Wo Contrast  Result Date: 09/22/2018 CLINICAL DATA:  Abdominal distension. Elevated white blood  cell count and liver functions. Polysubstance abuse. Elevated creatinine. EXAM: CT ABDOMEN AND PELVIS WITHOUT CONTRAST TECHNIQUE: Multidetector CT imaging of the abdomen and pelvis was performed following the standard protocol without IV contrast. COMPARISON:  July 29, 2018 FINDINGS: Lower chest: Mild patchy infiltrate in the left lung base. There is air adjacent to the heart and mediastinum as seen on series 3, image 8 and images 6 and 7. This region is incompletely evaluated due to motion. Hepatobiliary: No focal liver abnormality is seen. Status post cholecystectomy. No biliary dilatation. Pancreas: Unremarkable. No pancreatic ductal dilatation or surrounding inflammatory changes. Spleen: Normal in size without focal abnormality. Adrenals/Urinary Tract: Adrenal glands are normal. No renal stones, masses, or hydronephrosis. The ureters are nondilated with no stones identified. The bladder is unremarkable. Stomach/Bowel: The stomach and small bowel are normal. The colon is unremarkable. The appendix is normal. Vascular/Lymphatic: Air in the proximal left femoral vein and external iliac vein and a branching vessel are likely from a left-sided injection or IV. No other abnormalities. Reproductive: Status post hysterectomy. No adnexal masses. Other: No abdominal wall hernia or abnormality. No abdominopelvic ascites. Musculoskeletal: No acute or significant osseous findings. IMPRESSION: 1. There appears to be air tracking along the superior vena cava and adjacent to the heart. This region is poorly evaluated due to motion and the finding could be artifactual. Recommend a dedicated CT scan of the chest for further evaluation. 2. Mild patchy infiltrate in left base worrisome for pneumonia or aspiration. 3. Air in the proximal left femoral vein, external iliac vein, and a branching vessel are likely due to a left-sided IV or injection. Recommend clinical correlation. Electronically Signed   By: Gerome Sam III M.D    On: 09/22/2018 18:47   Ct Head Wo Contrast  Result Date: 09/22/2018 CLINICAL DATA:  Altered mental status. Found unresponsive in bathroom. EXAM: CT HEAD WITHOUT CONTRAST CT CERVICAL SPINE WITHOUT CONTRAST TECHNIQUE: Multidetector CT imaging of the head and cervical spine was performed following the standard protocol without intravenous contrast. Multiplanar CT image reconstructions of the cervical spine were also generated. COMPARISON:  None. FINDINGS: CT HEAD FINDINGS Brain: There is no evidence of acute intracranial hemorrhage, mass lesion, brain edema or extra-axial fluid collection. The ventricles and subarachnoid spaces are appropriately sized for age. There is no CT evidence of acute cortical infarction. Vascular:  No hyperdense vessel identified. Skull: Negative for fracture or focal lesion. Sinuses/Orbits: The visualized paranasal sinuses and mastoid air cells are clear. No orbital abnormalities are seen. Other: None. CT CERVICAL SPINE FINDINGS Alignment: Near anatomic. There is mild straightening and a minimal convex left scoliosis. Skull base and vertebrae: No evidence of acute fracture or traumatic subluxation. Soft tissues and spinal canal: No prevertebral fluid or swelling. No visible canal hematoma. Disc levels: Mild spondylosis with scattered  uncinate spurring. No large disc herniation or high-grade spinal stenosis identified. Upper chest: Unremarkable. Other: None. IMPRESSION: 1. Normal head CT without acute intracranial findings. 2. No evidence of acute cervical spine fracture, traumatic subluxation or static signs of instability. Mild cervical spondylosis. Electronically Signed   By: Carey Bullocks M.D.   On: 09/22/2018 15:31   Ct Cervical Spine Wo Contrast  Result Date: 09/22/2018 CLINICAL DATA:  Altered mental status. Found unresponsive in bathroom. EXAM: CT HEAD WITHOUT CONTRAST CT CERVICAL SPINE WITHOUT CONTRAST TECHNIQUE: Multidetector CT imaging of the head and cervical spine was  performed following the standard protocol without intravenous contrast. Multiplanar CT image reconstructions of the cervical spine were also generated. COMPARISON:  None. FINDINGS: CT HEAD FINDINGS Brain: There is no evidence of acute intracranial hemorrhage, mass lesion, brain edema or extra-axial fluid collection. The ventricles and subarachnoid spaces are appropriately sized for age. There is no CT evidence of acute cortical infarction. Vascular:  No hyperdense vessel identified. Skull: Negative for fracture or focal lesion. Sinuses/Orbits: The visualized paranasal sinuses and mastoid air cells are clear. No orbital abnormalities are seen. Other: None. CT CERVICAL SPINE FINDINGS Alignment: Near anatomic. There is mild straightening and a minimal convex left scoliosis. Skull base and vertebrae: No evidence of acute fracture or traumatic subluxation. Soft tissues and spinal canal: No prevertebral fluid or swelling. No visible canal hematoma. Disc levels: Mild spondylosis with scattered uncinate spurring. No large disc herniation or high-grade spinal stenosis identified. Upper chest: Unremarkable. Other: None. IMPRESSION: 1. Normal head CT without acute intracranial findings. 2. No evidence of acute cervical spine fracture, traumatic subluxation or static signs of instability. Mild cervical spondylosis. Electronically Signed   By: Carey Bullocks M.D.   On: 09/22/2018 15:31   Dg Chest Portable 1 View  Result Date: 09/23/2018 CLINICAL DATA:  Endotracheal placement. Altered mental status. Unresponsive. EXAM: PORTABLE CHEST 1 VIEW COMPARISON:  09/22/2018 FINDINGS: Endotracheal tube is 3.5 cm above the carina. Nasogastric tube enters the stomach. Heart size is normal. There may be minimal atelectasis in the left lower lobe. Lungs are otherwise clear. IMPRESSION: Endotracheal tube and nasogastric tube well positioned. Lungs clear except for mild atelectasis in the left lower lobe. Electronically Signed   By: Paulina Fusi M.D.   On: 09/23/2018 06:55   Dg Chest Portable 1 View  Result Date: 09/22/2018 CLINICAL DATA:  Altered mental status/unresponsive EXAM: PORTABLE CHEST 1 VIEW COMPARISON:  July 03, 2017 FINDINGS: No edema or consolidation. The heart size and pulmonary vascularity are normal. No adenopathy. No pneumothorax. There is an old healed fracture the lateral right fifth rib. IMPRESSION: No edema or consolidation. Electronically Signed   By: Bretta Bang III M.D.   On: 09/22/2018 16:09   US Liver Doppler  Result Date: 09/23/2018 CLINICAL DATA:  Transaminitis. EXAM: DUPLEX ULTRASOUND OF LIVER TECHNIQUE: Color and duplex Doppler ultrasound was performed to evaluate the hepatic in-flow and out-flow vessels. COMPARISON:  CT 09/22/2018 FINDINGS: Liver: Normal parenchymal echogenicity. Normal hepatic contour without nodularity. No focal lesion, mass or intrahepatic biliary ductal dilatation. Main Portal Vein size: 1.2 cm Portal Vein Velocities Main Prox:  45.4 cm/sec Main Mid: 39.8 cm/sec Main Dist:  46.6 cm/sec Right: 32.2 cm/sec Left: 37.3 cm/sec Hepatic Vein Velocities Right:  15.8 cm/sec Middle:  41.6 cm/sec Left:  20.7 cm/sec IVC: Present and patent with normal respiratory phasicity. Hepatic Artery Velocity:  82.3 cm/sec Splenic Vein Velocity:  30.8 cm/sec Spleen: 8.1 cm x 3.7 cm x 10.2 cm with a  total volume of 160 cm^3 (411 cm^3 is upper limit normal) Portal Vein Occlusion/Thrombus: No Splenic Vein Occlusion/Thrombus: No Ascites: None Varices: None IMPRESSION: Normal Doppler evaluation of the liver. No evidence of portal hypertension. Electronically Signed   By: Elberta Fortis M.D.   On: 09/23/2018 02:48    Consults: Treatment Team:  Lamar Blinks, MD   Subjective:    Overnight Issues:   Objective:  Vital signs for last 24 hours: Temp:  [97.9 F (36.6 C)-98.4 F (36.9 C)] 98 F (36.7 C) (02/16 1955) Pulse Rate:  [38-86] 38 (02/16 2200) Resp:  [12-23] 20 (02/16 2200) BP:  (114-153)/(68-92) 133/70 (02/16 2200) SpO2:  [98 %-100 %] 100 % (02/16 2200) FiO2 (%):  [30 %] 30 % (02/16 1610) Weight:  [71.8 kg] 71.8 kg (02/16 0200)  Hemodynamic parameters for last 24 hours:    Intake/Output from previous day: 02/15 0701 - 02/16 0700 In: 1126.5 [I.V.:718.1; IV Piggyback:408.3] Out: 1550 [Urine:1550]  Intake/Output this shift: Total I/O In: -  Out: 201 [Urine:200; Stool:1]  Vent settings for last 24 hours: Vent Mode: PSV FiO2 (%):  [30 %] 30 % Set Rate:  [16 bmp] 16 bmp Vt Set:  [450 mL] 450 mL PEEP:  [5 cmH20] 5 cmH20 Pressure Support:  [5 cmH20] 5 cmH20  Physical Exam:  General: Well-nourished, well-developed, sedated, mechanically ventilated.   HEENT: PERRLA, trachea midline, no JVD, oral mucosa moist.  Orotracheally intubated.  OG in place. Neuro: Withdraws to noxious stimulus, corneal reflexes intact, gag reflex intact.  On wake-up assessment tracks but does not follow commands. Cardiovascular: Apical pulse regular, S1-S2, no murmur regurg or gallop, +2 pulses bilaterally.  Occasional episodes of bradycardia. Lungs: Bilateral breath sounds, no wheezes or rhonchi.  Synchronous with the ventilator, copious ETT secretions white to gray. Abdomen: Nondistended, normal bowel sounds in all 4 quadrants, palpation reveals no organomegaly.  No grimacing on palpation. Musculoskeletal: Positive range of motion, no deformities Skin: Warm and dry, multiple tattoos.  Assessment/Plan:  1.  Acute hypoxic respiratory failure, ventilator dependent, due to polysubstance overdose (amphetamines benzodiazepines cannabinoids opiates).  She is tolerating spontaneous breathing trial, has strong cough and can clear secretions.  She is still somewhat encephalopathic but merits trial of extubation.  Extubate with close monitoring.  2.  Polysubstance abuse: This apparently is resulting from issues with PTSD.  Patient will benefit from psych evaluation if she can participate with the  evaluation, once extubated.  3.  Encephalopathy due to the above.  Monitor.  Limit sedatives.  Cannot exclude mild overlay of anoxic encephalopathy given the circumstances of how she was found.  4.  Elevated troponins with mild decrease in left ventricular ejection fraction, no wall motion abnormalities, cardiology does not recommend further work-up.  Suspect issues related to her acute critical illness.  5.  Episodes of bradycardia these were attributed to IV sedatives during mechanical ventilation however these have been discontinued and she continues to have some of these issues.  Will check TSH.  6.  Aspiration pneumonia, on antibiotics.  Sputum culture pending.  7.  Acute kidney injury, her urine output has been excellent.  She has been volume resuscitated, recheck renal panel.    LOS: 1 day   Additional comments: Weaning strategy and ventilator management discussed with RT during rounds.  Critical Care Total Time*: 35 Minutes  Sarina Ser 09/24/2018  *Care during the described time interval was provided by me and/or other providers on the critical care team.  I have reviewed this patient's  available data, including medical history, events of note, physical examination and test results as part of my evaluation.

## 2018-09-25 DIAGNOSIS — G9340 Encephalopathy, unspecified: Secondary | ICD-10-CM

## 2018-09-25 DIAGNOSIS — F1910 Other psychoactive substance abuse, uncomplicated: Secondary | ICD-10-CM

## 2018-09-25 DIAGNOSIS — E870 Hyperosmolality and hypernatremia: Secondary | ICD-10-CM

## 2018-09-25 DIAGNOSIS — F4310 Post-traumatic stress disorder, unspecified: Secondary | ICD-10-CM

## 2018-09-25 DIAGNOSIS — F061 Catatonic disorder due to known physiological condition: Secondary | ICD-10-CM

## 2018-09-25 DIAGNOSIS — N179 Acute kidney failure, unspecified: Secondary | ICD-10-CM

## 2018-09-25 DIAGNOSIS — R945 Abnormal results of liver function studies: Secondary | ICD-10-CM

## 2018-09-25 DIAGNOSIS — T50902A Poisoning by unspecified drugs, medicaments and biological substances, intentional self-harm, initial encounter: Secondary | ICD-10-CM

## 2018-09-25 LAB — GLUCOSE, CAPILLARY: Glucose-Capillary: 79 mg/dL (ref 70–99)

## 2018-09-25 LAB — RENAL FUNCTION PANEL
Albumin: 3.3 g/dL — ABNORMAL LOW (ref 3.5–5.0)
Anion gap: 10 (ref 5–15)
BUN: 45 mg/dL — ABNORMAL HIGH (ref 6–20)
CO2: 24 mmol/L (ref 22–32)
Calcium: 8 mg/dL — ABNORMAL LOW (ref 8.9–10.3)
Chloride: 119 mmol/L — ABNORMAL HIGH (ref 98–111)
Creatinine, Ser: 1.66 mg/dL — ABNORMAL HIGH (ref 0.44–1.00)
GFR calc non Af Amer: 38 mL/min — ABNORMAL LOW (ref >60)
GFR, EST AFRICAN AMERICAN: 45 mL/min — AB (ref >60)
Glucose, Bld: 130 mg/dL — ABNORMAL HIGH (ref 70–99)
Phosphorus: 3.1 mg/dL (ref 2.5–4.6)
Potassium: 3 mmol/L — ABNORMAL LOW (ref 3.5–5.1)
Sodium: 153 mmol/L — ABNORMAL HIGH (ref 135–145)

## 2018-09-25 LAB — CBC WITH DIFFERENTIAL/PLATELET
Abs Immature Granulocytes: 0.02 10*3/uL (ref 0.00–0.07)
Basophils Absolute: 0 10*3/uL (ref 0.0–0.1)
Basophils Relative: 0 %
Eosinophils Absolute: 0.1 10*3/uL (ref 0.0–0.5)
Eosinophils Relative: 2 %
HCT: 32.3 % — ABNORMAL LOW (ref 36.0–46.0)
HEMOGLOBIN: 10.5 g/dL — AB (ref 12.0–15.0)
Immature Granulocytes: 0 %
Lymphocytes Relative: 20 %
Lymphs Abs: 1.1 10*3/uL (ref 0.7–4.0)
MCH: 30.3 pg (ref 26.0–34.0)
MCHC: 32.5 g/dL (ref 30.0–36.0)
MCV: 93.1 fL (ref 80.0–100.0)
Monocytes Absolute: 0.3 10*3/uL (ref 0.1–1.0)
Monocytes Relative: 6 %
NRBC: 0 % (ref 0.0–0.2)
Neutro Abs: 4.2 10*3/uL (ref 1.7–7.7)
Neutrophils Relative %: 72 %
Platelets: 86 10*3/uL — ABNORMAL LOW (ref 150–400)
RBC: 3.47 MIL/uL — ABNORMAL LOW (ref 3.87–5.11)
RDW: 12.7 % (ref 11.5–15.5)
Smear Review: NORMAL
WBC: 5.8 10*3/uL (ref 4.0–10.5)

## 2018-09-25 LAB — MAGNESIUM: MAGNESIUM: 2.7 mg/dL — AB (ref 1.7–2.4)

## 2018-09-25 LAB — LEGIONELLA PNEUMOPHILA SEROGP 1 UR AG: L. pneumophila Serogp 1 Ur Ag: NEGATIVE

## 2018-09-25 LAB — CK: Total CK: 294 U/L — ABNORMAL HIGH (ref 38–234)

## 2018-09-25 LAB — TSH: TSH: 3.01 u[IU]/mL (ref 0.350–4.500)

## 2018-09-25 MED ORDER — POTASSIUM CHLORIDE 2 MEQ/ML IV SOLN
INTRAVENOUS | Status: DC
  Administered 2018-09-25 – 2018-09-28 (×4): via INTRAVENOUS
  Filled 2018-09-25 (×7): qty 1000

## 2018-09-25 MED ORDER — LORAZEPAM 2 MG/ML IJ SOLN
1.0000 mg | Freq: Three times a day (TID) | INTRAMUSCULAR | Status: DC
  Administered 2018-09-25 – 2018-09-26 (×3): 1 mg via INTRAVENOUS
  Filled 2018-09-25 (×3): qty 1

## 2018-09-25 MED ORDER — ENOXAPARIN SODIUM 40 MG/0.4ML ~~LOC~~ SOLN
40.0000 mg | SUBCUTANEOUS | Status: DC
  Administered 2018-09-25 – 2018-09-27 (×3): 40 mg via SUBCUTANEOUS
  Filled 2018-09-25 (×3): qty 0.4

## 2018-09-25 MED ORDER — ENSURE ENLIVE PO LIQD
237.0000 mL | Freq: Two times a day (BID) | ORAL | Status: DC
  Administered 2018-09-25 – 2018-09-28 (×6): 237 mL via ORAL

## 2018-09-25 MED ORDER — POTASSIUM CHLORIDE 10 MEQ/100ML IV SOLN
10.0000 meq | INTRAVENOUS | Status: AC
  Administered 2018-09-25 (×4): 10 meq via INTRAVENOUS
  Filled 2018-09-25 (×4): qty 100

## 2018-09-25 NOTE — Care Management Note (Signed)
Case Management Note  Patient Details  Name: Kylie Lam MRN: [id number] Date of Birth: 10-22-1980  Subjective/Objective:     Patient admitted with a drug overdose.  Patient was intubated in the emergency room after concern for airway protection and transferred to the ICU.  Patient was extubated yesterday.  Patient is a 1:1 sitter has bilateral hand mitts.  Patient does not follow commands and is just staring with no response to voice.  Kylie Lam is the patient's father and is the responsible party for the patient.  Patient is with the [facility] she gets PCP services there.  Father Kylie Maduro agrees to transfer to the [place], since the patient is unable to speak for herself. RNCM will initiate transfer paperwork. Father did give contact info for the patient's sister Kylie Lam [phone] and father reports that significant other Kylie Lam should have no information or communication about the patient.  Kylie Lis RN BSN                Action/Plan:   Expected Discharge Date:                  Expected Discharge Plan:     In-House Referral:     Discharge planning Services     Post Acute Care Choice:    Choice offered to:     DME Arranged:    DME Agency:     HH Arranged:    HH Agency:     Status of Service:     If discussed at [employer], dates discussed:    Additional Comments:  Kylie Butcher, RN 09/25/2018, 12:10 PM
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
## 2018-09-25 NOTE — Progress Notes (Signed)
There is that me blankly.  Only verbalizes "hi".  No overt respiratory distress.  Occasionally agitated.  Vitals:   09/25/18 0212 09/25/18 0400 09/25/18 0700 09/25/18 0811  BP: 136/83 122/74 120/68   Pulse: (!) 42 (!) 53 (!) 39   Resp: 17 14 17    Temp:  (!) 97.5 F (36.4 C)    TempSrc:  Axillary    SpO2: 97% 100% 98% 98%  Weight:      Height:        Gen: Nonverbal, NAD HEENT: NCAT, sclerae white, oropharynx normal Neck: No LAN, no JVD noted Lungs: full BS, no adventitious sounds Cardiovascular: Bradycardic, regular, no M noted Abdomen: Soft, NT, +BS Ext: no C/C/E Neuro: PERRL, EOMI, motor/sensory grossly intact Skin: No lesions noted  BMP Latest Ref Rng & Units 09/25/2018 09/23/2018 09/23/2018  Glucose 70 - 99 mg/dL 712(W) 580(D) 983(J)  BUN 6 - 20 mg/dL 82(N) 05(L) 97(Q)  Creatinine 0.44 - 1.00 mg/dL 7.34(L) 9.37(T) 0.24(O)  Sodium 135 - 145 mmol/L 153(H) 148(H) 144  Potassium 3.5 - 5.1 mmol/L 3.0(L) 3.1(L) 3.5  Chloride 98 - 111 mmol/L 119(H) 120(H) 114(H)  CO2 22 - 32 mmol/L 24 19(L) 21(L)  Calcium 8.9 - 10.3 mg/dL 8.0(L) 8.0(L) 7.7(L)   CBC Latest Ref Rng & Units 09/25/2018 09/23/2018 09/23/2018  WBC 4.0 - 10.5 K/uL 5.8 8.6 13.8(H)  Hemoglobin 12.0 - 15.0 g/dL 10.5(L) 11.6(L) 12.8  Hematocrit 36.0 - 46.0 % 32.3(L) 34.8(L) 38.8  Platelets 150 - 400 K/uL 86(L) 114(L) 133(L)   CXR: NNF  IMPRESSION: Respiratory failure, ventilator dependence-resolved Polysubstance overdose and chronic abuse Acute encephalopathy Mild sinus bradycardia, asymptomatic AKI, Cr improving Severe hypernatremia Elevated LFTs   PLAN/REC: Monitor in SDU through today, tonight IVFs adjusted to replete free water deficit Psychiatry consultation requested Monitor BMET intermittently Monitor I/Os Correct electrolytes as indicated Hemodynamic, telemetry monitoring Recheck LFTs 2/18  Billy Fischer, MD PCCM service Mobile [phone] Pager [phone] 09/25/2018 6:13 PM

## 2018-09-25 NOTE — Progress Notes (Signed)
[facility]   PATIENT NAME: Kylie Lam    MR#:  [id number]  DATE OF BIRTH:  Oct 26, 1980  SUBJECTIVE:  CHIEF COMPLAINT:   Chief Complaint  Patient presents with  . Altered Mental Status   On full ventilatory support with 30% oxygen. Sedation being turned down. Afebrile  REVIEW OF SYSTEMS:    Review of Systems  Unable to perform ROS: Intubated   DRUG ALLERGIES:   Allergies  Allergen Reactions  . Baclofen Other (See Comments)    Dizzy, falling, can't control actions  . Haldol [Haloperidol Lactate] Other (See Comments)    Stiffness  . Prozac [Fluoxetine Hcl]     Redness all over body.    VITALS:  Blood pressure 120/68, pulse (!) 39, temperature (!) 97.5 F (36.4 C), temperature source Axillary, resp. rate 17, height 5\' 9"  (1.753 m), weight 71.8 kg, SpO2 98 %.  PHYSICAL EXAMINATION:   Physical Exam  GENERAL:  38 y.o.-year-old patient lying in the bed .  On ventilator.  Critically ill. EYES: Pupils equal, round, reactive to light and accommodation. No scleral icterus. Extraocular muscles intact.  HEENT: Head atraumatic, normocephalic. Oropharynx and nasopharynx clear.  ET tube in place NECK:  Supple, no jugular venous distention. No thyroid enlargement, no tenderness.  LUNGS: Normal breath sounds bilaterally, no wheezing, rales, rhonchi. No use of accessory muscles of respiration.  CARDIOVASCULAR: S1, S2 normal. No murmurs, rubs, or gallops.  ABDOMEN: Soft, nontender, nondistended. Bowel sounds present. No organomegaly or mass.  EXTREMITIES: No cyanosis, clubbing or edema b/l.    NEUROLOGIC: Cranial nerves II through XII are intact. No focal Motor or sensory deficits b/l.   PSYCHIATRIC: The patient is sedated SKIN: No obvious rash, lesion, or ulcer.   LABORATORY PANEL:   CBC Recent Labs  Lab 09/25/18 0429  WBC 5.8  HGB 10.5*  HCT 32.3*  PLT 86*    ------------------------------------------------------------------------------------------------------------------ Chemistries  Recent Labs  Lab 09/23/18 2104 09/25/18 0429  NA 148* 153*  K 3.1* 3.0*  CL 120* 119*  CO2 19* 24  GLUCOSE 114* 130*  BUN 60* 45*  CREATININE 2.55* 1.66*  CALCIUM 8.0* 8.0*  MG 2.2 2.7*  AST 538*  --   ALT 1,584*  --   ALKPHOS 55  --   BILITOT 1.0  --    ------------------------------------------------------------------------------------------------------------------  Cardiac Enzymes Recent Labs  Lab 09/23/18 2104  TROPONINI 0.88*   ------------------------------------------------------------------------------------------------------------------  RADIOLOGY:  No results found. ASSESSMENT AND PLAN:   * Vent dependent resp failure Continue full ventilatory support. Wean sedation. Likely extubation later today  * Acute encephalopathy Due to drug abuse. Will need counseling once awake  * Aspiration with no infiltrates on chest xray. On abx emperically  * Shock liver Follow enzymes. Needs RUQ [place]  * AKI : IV hydration. Hold nephrotoxic agents.  * Elevated troponin: Demand ischemia and rhabdomyolysis causing elevation . Appreciate cardiology input. Heparin stopped  *  Rhabdomyolysis IVF Monitor CK levels and BUN/Cr  DVT prophylaxis: Heparin  GI prophylaxis: IV PPI  All the records are reviewed and case discussed with Care Management/Social Worker Management plans discussed with the patient, family and they are in agreement.  CODE STATUS: FULL CODE  DVT Prophylaxis: SCDs  TOTAL CRITICAL CARE TIME TAKING CARE OF THIS PATIENT: 35 minutes.   POSSIBLE D/C IN 3-4 DAYS, DEPENDING ON CLINICAL CONDITION.  Orie Fisherman M.D on 09/25/2018 at 8:16 AM  Between 7am to 6pm - Pager - [phone]  After  6pm go to [email or web address] - password [email or web address]  [facility]  Office  [phone]  CC: Primary care  physician; System, Pcp Not In  Note: This dictation was prepared with Dragon dictation along with smaller phrase technology. Any transcriptional errors that result from this process are unintentional.
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
## 2018-09-25 NOTE — Care Management Note (Signed)
Case Management Note  Patient Details  Name: Mackenna Serrette MRN: [id number] Date of Birth: [birth year]  Subjective/Objective:    Consent to transfer to [facility] and clinical information faxed to [place] at [phone].  Transfer center contacted and aware of information being sent.  Transfer center informed that at this time the [place] is full so they may not accept the transfer.  RNCM will cont to follow. Robbie Lis RN BSN [phone]                 Action/Plan:   Expected Discharge Date:                  Expected Discharge Plan:     In-House Referral:     Discharge planning Services     Post Acute Care Choice:    Choice offered to:     DME Arranged:    DME Agency:     HH Arranged:    HH Agency:     Status of Service:     If discussed at Long Length of Stay Meetings, dates discussed:    Additional Comments:  Allayne Butcher, RN 09/25/2018, 3:42 PM

## 2018-09-25 NOTE — Progress Notes (Addendum)
Nutrition Follow-up  DOCUMENTATION CODES:   Not applicable  INTERVENTION:  Provide Ensure Enlive po BID, each supplement provides 350 kcal and 20 grams of protein. Can decrease/discontinue once patient more alert and PO intake returns to baseline.  NUTRITION DIAGNOSIS:   Inadequate oral intake related to inability to eat as evidenced by NPO status.  Resolving - patient now started on diet after extubation.  GOAL:   Provide needs based on ASPEN/SCCM guidelines  Progressing.  MONITOR:   Vent status, Labs, Weight trends, TF tolerance, I & O's  REASON FOR ASSESSMENT:   Ventilator    ASSESSMENT:   38 year old female with PMHx of anxiety, depression, sleep disorder, IBS, GERD, hx TIA, PTSD, asthma admitted with acute hypoxic respiratory failure secondary to unintentional overdose requiring intubation on 2/15, rhabdomyolysis, acute renal failure.  Patient was extubated yesterday evening. Tube feeds were never initiated. Following SLP evaluation this AM diet was advanced to dysphagia 3 with minced meats and thin liquids. Patient still lethargic and also with mitts. Will provide ONS to help meet calorie/protein needs until PO intake able to return to baseline.  Medications reviewed and include: DtW at 75 mL/hr (90 grams dextrose, 306 kcal daily).  Labs reviewed: Sodium 153 (trending up), Potassium 3, Chloride 119, BUN 45, Creatinine 1.66, Magnesium 27.  Discussed with RN and on rounds.  Diet Order:   Diet Order            DIET DYS 3 Room service appropriate? Yes with Assist; Fluid consistency: Thin  Diet effective now             EDUCATION NEEDS:   No education needs have been identified at this time  Skin:  Skin Assessment: Reviewed RN Assessment  Last BM:  09/25/2018 - medium type 7  Height:   Ht Readings from Last 1 Encounters:  09/24/18 5\' 9"  (1.753 m)   Weight:   Wt Readings from Last 1 Encounters:  09/24/18 71.8 kg   Ideal Body Weight:  65.9 kg  BMI:   Body mass index is 23.38 kg/m.  Estimated Nutritional Needs:   Kcal:  1750-2045 (MSJ x 1.2-1.4)  Protein:  86-108 grams (1.2-1.5 grams/kg)  Fluid:  2.1-2.5 L/day (30-35 mL/kg)  Helane Rima, MS, RD, LDN Office: [phone] Pager: [phone] After Hours/Weekend Pager: [phone]

## 2018-09-25 NOTE — Plan of Care (Addendum)
Pt still unable to communicate need, has focus look on her face, will not respond to any question, will follow any command tracks staff in the room. Localized pain on all four ext , VSS.

## 2018-09-26 DIAGNOSIS — F061 Catatonic disorder due to known physiological condition: Secondary | ICD-10-CM

## 2018-09-26 DIAGNOSIS — F4310 Post-traumatic stress disorder, unspecified: Secondary | ICD-10-CM

## 2018-09-26 DIAGNOSIS — T50902A Poisoning by unspecified drugs, medicaments and biological substances, intentional self-harm, initial encounter: Secondary | ICD-10-CM

## 2018-09-26 DIAGNOSIS — N179 Acute kidney failure, unspecified: Secondary | ICD-10-CM

## 2018-09-26 LAB — COMPREHENSIVE METABOLIC PANEL
ALT: 679 U/L — ABNORMAL HIGH (ref 0–44)
AST: 88 U/L — ABNORMAL HIGH (ref 15–41)
Albumin: 3.6 g/dL (ref 3.5–5.0)
Alkaline Phosphatase: 62 U/L (ref 38–126)
Anion gap: 6 (ref 5–15)
BUN: 39 mg/dL — ABNORMAL HIGH (ref 6–20)
CHLORIDE: 117 mmol/L — AB (ref 98–111)
CO2: 24 mmol/L (ref 22–32)
Calcium: 8.5 mg/dL — ABNORMAL LOW (ref 8.9–10.3)
Creatinine, Ser: 1.24 mg/dL — ABNORMAL HIGH (ref 0.44–1.00)
GFR calc Af Amer: >60 mL/min (ref >60)
GFR, EST NON AFRICAN AMERICAN: 55 mL/min — AB (ref >60)
Glucose, Bld: 110 mg/dL — ABNORMAL HIGH (ref 70–99)
Potassium: 3.8 mmol/L (ref 3.5–5.1)
Sodium: 147 mmol/L — ABNORMAL HIGH (ref 135–145)
Total Bilirubin: 1 mg/dL (ref 0.3–1.2)
Total Protein: 6.2 g/dL — ABNORMAL LOW (ref 6.5–8.1)

## 2018-09-26 LAB — HEPATITIS PANEL, ACUTE
HCV Ab: <0.1 s/co ratio (ref 0.0–0.9)
Hep A IgM: NEGATIVE
Hep B C IgM: NEGATIVE
Hepatitis B Surface Ag: NEGATIVE

## 2018-09-26 LAB — CBC
HCT: 34 % — ABNORMAL LOW (ref 36.0–46.0)
Hemoglobin: 11 g/dL — ABNORMAL LOW (ref 12.0–15.0)
MCH: 30.6 pg (ref 26.0–34.0)
MCHC: 32.4 g/dL (ref 30.0–36.0)
MCV: 94.7 fL (ref 80.0–100.0)
Platelets: 98 10*3/uL — ABNORMAL LOW (ref 150–400)
RBC: 3.59 MIL/uL — AB (ref 3.87–5.11)
RDW: 12.5 % (ref 11.5–15.5)
WBC: 7.6 10*3/uL (ref 4.0–10.5)
nRBC: 0 % (ref 0.0–0.2)

## 2018-09-26 MED ORDER — ONDANSETRON HCL 4 MG/2ML IJ SOLN
4.0000 mg | INTRAMUSCULAR | Status: DC | PRN
  Administered 2018-09-27: 4 mg via INTRAVENOUS
  Filled 2018-09-26: qty 2

## 2018-09-26 MED ORDER — ORAL CARE MOUTH RINSE
15.0000 mL | Freq: Two times a day (BID) | OROMUCOSAL | Status: DC
  Administered 2018-09-26 – 2018-09-28 (×5): 15 mL via OROMUCOSAL

## 2018-09-26 MED ORDER — LORAZEPAM 2 MG/ML IJ SOLN: 1.0000 mg | Freq: Four times a day (QID) | INTRAMUSCULAR | Status: DC | PRN

## 2018-09-26 MED ORDER — ONDANSETRON HCL 4 MG/2ML IJ SOLN
4.0000 mg | Freq: Once | INTRAMUSCULAR | Status: AC
  Administered 2018-09-26: 4 mg via INTRAVENOUS

## 2018-09-26 MED ORDER — PROMETHAZINE HCL 25 MG/ML IJ SOLN
12.5000 mg | Freq: Four times a day (QID) | INTRAMUSCULAR | Status: DC | PRN
  Administered 2018-09-27: 12.5 mg via INTRAVENOUS
  Filled 2018-09-26: qty 1

## 2018-09-26 MED ORDER — ONDANSETRON HCL 4 MG/2ML IJ SOLN
4.0000 mg | Freq: Four times a day (QID) | INTRAMUSCULAR | Status: DC | PRN
  Administered 2018-09-26 (×2): 4 mg via INTRAVENOUS
  Filled 2018-09-26 (×3): qty 2

## 2018-09-26 NOTE — Progress Notes (Signed)
[facility]   PATIENT NAME: Kylie Lam    MR#:  [id number]  DATE OF BIRTH:  04/01/1981  SUBJECTIVE:  CHIEF COMPLAINT:   Chief Complaint  Patient presents with  . Altered Mental Status   Extubated. Patient awake but non verbal  REVIEW OF SYSTEMS:    Review of Systems  Unable to perform ROS: Mental status change   DRUG ALLERGIES:   Allergies  Allergen Reactions  . Baclofen Other (See Comments)    Dizzy, falling, can't control actions  . Haldol [Haloperidol Lactate] Other (See Comments)    Stiffness  . Prozac [Fluoxetine Hcl]     Redness all over body.    VITALS:  Blood pressure (!) 146/75, pulse (!) 56, temperature 97.7 F (36.5 C), temperature source Axillary, resp. rate (!) 23, height 5\' 9"  (1.753 m), weight 69.8 kg, SpO2 98 %.  PHYSICAL EXAMINATION:   Physical Exam  GENERAL:  38 y.o.-year-old patient lying in the bed .  EYES: Pupils equal, round, reactive to light and accommodation. No scleral icterus. Extraocular muscles intact.  HEENT: Head atraumatic, normocephalic.  NECK:  Supple, no jugular venous distention. No thyroid enlargement, no tenderness.  LUNGS: Normal breath sounds bilaterally, no wheezing, rales, rhonchi. No use of accessory muscles of respiration.  CARDIOVASCULAR: S1, S2 normal. No murmurs, rubs, or gallops.  ABDOMEN: Soft, nontender, nondistended. Bowel sounds present. No organomegaly or mass.  EXTREMITIES: No cyanosis, clubbing or edema b/l.    NEUROLOGIC: Not following instructions PSYCHIATRIC: The patient is non verbal SKIN: No obvious rash, lesion, or ulcer.   LABORATORY PANEL:   CBC Recent Labs  Lab 09/26/18 0537  WBC 7.6  HGB 11.0*  HCT 34.0*  PLT 98*   ------------------------------------------------------------------------------------------------------------------ Chemistries  Recent Labs  Lab 09/25/18 0429 09/26/18 0537  NA 153* 147*  K 3.0* 3.8  CL 119* 117*  CO2 24 24   GLUCOSE 130* 110*  BUN 45* 39*  CREATININE 1.66* 1.24*  CALCIUM 8.0* 8.5*  MG 2.7*  --   AST  --  88*  ALT  --  679*  ALKPHOS  --  62  BILITOT  --  1.0   ------------------------------------------------------------------------------------------------------------------  Cardiac Enzymes Recent Labs  Lab 09/23/18 2104  TROPONINI 0.88*   ------------------------------------------------------------------------------------------------------------------  RADIOLOGY:  No results found. ASSESSMENT AND PLAN:   * Vent dependent resp failure Extubated 09/25/2018  * Acute metabolic and toxic encephalopathy Urine drug screen positive for multiple drugs  * Aspiration with no infiltrates on chest xray. Off abx now  * Shock liver Improving LFTs Seen by GI  * AKI  Resolved  * Elevated troponin: Demand ischemia and rhabdomyolysis causing elevation . Appreciate cardiology input. Heparin stopped  *  Rhabdomyolysis Resolved with IVF  DVT prophylaxis: Heparin   All the records are reviewed and case discussed with Care Management/Social Worker Management plans discussed with the patient, family and they are in agreement.  CODE STATUS: FULL CODE  TOTAL  TIME TAKING CARE OF THIS PATIENT: 35 minutes.   Orie Fisherman M.D on 09/26/2018 at 10:25 AM  Between 7am to 6pm - Pager - [phone]  After 6pm go to [email or web address] - password [email or web address]  [facility]  Office  [phone]  CC: Primary care physician; System, Pcp Not In  Note: This dictation was prepared with Dragon dictation along with smaller phrase technology. Any transcriptional errors that result from this process are unintentional.

## 2018-09-26 NOTE — Progress Notes (Signed)
Pt with episode of vomiting, Dr Katheren Shams aware, new orders placed

## 2018-09-26 NOTE — Progress Notes (Signed)
[facility] [place] at [facility]   PATIENT NAME: Kylie Lam    MR#:  [id number]  DATE OF BIRTH:  08-24-1980  SUBJECTIVE:  CHIEF COMPLAINT:   Chief Complaint  Patient presents with  . Altered Mental Status   Non verbal Sitter at bedside  REVIEW OF SYSTEMS:    Review of Systems  Unable to perform ROS: Mental status change   DRUG ALLERGIES:   Allergies  Allergen Reactions  . Baclofen Other (See Comments)    Dizzy, falling, can't control actions  . Haldol [Haloperidol Lactate] Other (See Comments)    Stiffness  . Prozac [Fluoxetine Hcl]     Redness all over body.    VITALS:  Blood pressure (!) 146/75, pulse (!) 56, temperature 97.7 F (36.5 C), temperature source Axillary, resp. rate (!) 23, height 5\' 9"  (1.753 m), weight 69.8 kg, SpO2 98 %.  PHYSICAL EXAMINATION:   Physical Exam  GENERAL:  38 y.o.-year-old patient lying in the bed .  EYES: Pupils equal, round, reactive to light and accommodation. No scleral icterus. Extraocular muscles intact.  HEENT: Head atraumatic, normocephalic.  NECK:  Supple, no jugular venous distention. No thyroid enlargement, no tenderness.  LUNGS: Normal breath sounds bilaterally, no wheezing, rales, rhonchi. No use of accessory muscles of respiration.  CARDIOVASCULAR: S1, S2 normal. No murmurs, rubs, or gallops.  ABDOMEN: Soft, nontender, nondistended. Bowel sounds present. No organomegaly or mass.  EXTREMITIES: No cyanosis, clubbing or edema b/l.    NEUROLOGIC: Not following instructions PSYCHIATRIC: The patient is non verbal SKIN: No obvious rash, lesion, or ulcer.   LABORATORY PANEL:   CBC Recent Labs  Lab 09/26/18 0537  WBC 7.6  HGB 11.0*  HCT 34.0*  PLT 98*   ------------------------------------------------------------------------------------------------------------------ Chemistries  Recent Labs  Lab 09/25/18 0429 09/26/18 0537  NA 153* 147*  K 3.0* 3.8  CL 119* 117*  CO2 24 24  GLUCOSE 130*  110*  BUN 45* 39*  CREATININE 1.66* 1.24*  CALCIUM 8.0* 8.5*  MG 2.7*  --   AST  --  88*  ALT  --  679*  ALKPHOS  --  62  BILITOT  --  1.0   ------------------------------------------------------------------------------------------------------------------  Cardiac Enzymes Recent Labs  Lab 09/23/18 2104  TROPONINI 0.88*   ------------------------------------------------------------------------------------------------------------------  RADIOLOGY:  No results found. ASSESSMENT AND PLAN:   * Acute metabolic and toxic encephalopathy Urine drug screen positive for multiple drugs Likely has underlying psychiatric disease. Psychiatry following. Appreciate help  * Vent dependent resp failure Extubated 09/25/2018  * Aspiration with no infiltrates on chest xray. Off abx now  * Shock liver Improving LFTs Seen by GI  * AKI  Resolved  * Elevated troponin: Demand ischemia and rhabdomyolysis causing elevation . Appreciate cardiology input. Heparin stopped  *  Rhabdomyolysis Resolved with IVF  DVT prophylaxis: Heparin   All the records are reviewed and case discussed with Care Management/Social Worker Management plans discussed with the patient, family and they are in agreement.  CODE STATUS: FULL CODE  TOTAL  TIME TAKING CARE OF THIS PATIENT: 35 minutes.   Molinda Bailiff Chequita Mofield M.D on 09/26/2018 at 10:31 AM  Between 7am to 6pm - Pager - [phone]  After 6pm go to [email or web address] - password [email or web address]  [facility] [facility]  Office  [phone]  CC: Primary care physician; System, Pcp Not In  Note: This dictation was prepared with Dragon dictation along with smaller phrase technology. Any transcriptional errors that result from this process are unintentional.

## 2018-09-26 NOTE — Progress Notes (Signed)
Continues to stare blankly but verbalizes more readily this morning.  No overt distress.  No agitation noted but reportedly was agitated overnight.  Vitals:   09/26/18 1000 09/26/18 1100 09/26/18 1200 09/26/18 1219  BP: 139/79 138/79 133/77   Pulse: (!) 53 (!) 50 65 (!) 58  Resp: (!) 21 17 16  (!) 25  Temp:    98.7 F (37.1 C)  TempSrc:    Oral  SpO2: 100% 98% 100% 99%  Weight:      Height:      Room air  Gen: NAD HEENT: NCAT, sclera white Neck: No JVD Lungs: breath sounds full, no wheezes or other adventitious sounds Cardiovascular: RRR, no murmurs Abdomen: Soft, nontender, normal BS Ext: without clubbing, cyanosis, edema Neuro: grossly intact Skin: Limited exam, no lesions noted   BMP Latest Ref Rng & Units 09/26/2018 09/25/2018 09/23/2018  Glucose 70 - 99 mg/dL 572(I) 203(T) 597(C)  BUN 6 - 20 mg/dL 16(L) 84(T) 36(I)  Creatinine 0.44 - 1.00 mg/dL 6.80(H) 2.12(Y) 4.82(N)  Sodium 135 - 145 mmol/L 147(H) 153(H) 148(H)  Potassium 3.5 - 5.1 mmol/L 3.8 3.0(L) 3.1(L)  Chloride 98 - 111 mmol/L 117(H) 119(H) 120(H)  CO2 22 - 32 mmol/L 24 24 19(L)  Calcium 8.9 - 10.3 mg/dL 0.0(B) 7.0(W) 8.8(Q)   CBC Latest Ref Rng & Units 09/26/2018 09/25/2018 09/23/2018  WBC 4.0 - 10.5 K/uL 7.6 5.8 8.6  Hemoglobin 12.0 - 15.0 g/dL 11.0(L) 10.5(L) 11.6(L)  Hematocrit 36.0 - 46.0 % 34.0(L) 32.3(L) 34.8(L)  Platelets 150 - 400 K/uL 98(L) 86(L) 114(L)   CXR: NNF  IMPRESSION: VDRF, resolved Polysubstance overdose and chronic abuse Acute encephalopathy, seems to be improving slowly Sinus bradycardia, asymptomatic AKI, Cr continues to improve Hypernatremia, improving Elevated LFTs, improving   PLAN/REC: Transfer to MedSurg floor today Continue free water repletion Continue cardiac monitoring. Monitor BMET intermittently Monitor I/Os Correct electrolytes as indicated Recheck LFTs in next couple of days  After transfer, PCCM will sign off. Please call if we can be of further  assistance  Billy Fischer, MD PCCM service Mobile [phone] Pager [phone] 09/26/2018 1:58 PM

## 2018-09-26 NOTE — Care Management Note (Signed)
Case Management Note  Patient Details  Name: Gustie Pavon MRN: [id number] Date of Birth: [birth year]  Subjective/Objective:   RNCM received a call from [place] [facility]  [facility], [facility] and [facility] diversion.  The patient will stay on the list to be reviewed again tomorrow.  Robbie Lis RN BSN [phone]                  Action/Plan:   Expected Discharge Date:                  Expected Discharge Plan:     In-House Referral:     Discharge planning Services     Post Acute Care Choice:    Choice offered to:     DME Arranged:    DME Agency:     HH Arranged:    HH Agency:     Status of Service:     If discussed at Long Length of Stay Meetings, dates discussed:    Additional Comments:  Allayne Butcher, RN 09/26/2018, 11:37 AM
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
## 2018-09-27 ENCOUNTER — Inpatient Hospital Stay: Payer: No Typology Code available for payment source

## 2018-09-27 DIAGNOSIS — F061 Catatonic disorder due to known physiological condition: Secondary | ICD-10-CM

## 2018-09-27 DIAGNOSIS — I6783 Posterior reversible encephalopathy syndrome: Secondary | ICD-10-CM

## 2018-09-27 DIAGNOSIS — N179 Acute kidney failure, unspecified: Secondary | ICD-10-CM

## 2018-09-27 DIAGNOSIS — T50902A Poisoning by unspecified drugs, medicaments and biological substances, intentional self-harm, initial encounter: Secondary | ICD-10-CM

## 2018-09-27 DIAGNOSIS — R4182 Altered mental status, unspecified: Secondary | ICD-10-CM

## 2018-09-27 DIAGNOSIS — F4310 Post-traumatic stress disorder, unspecified: Secondary | ICD-10-CM

## 2018-09-27 LAB — GASTROINTESTINAL PANEL BY PCR, STOOL (REPLACES STOOL CULTURE)
ASTROVIRUS: NOT DETECTED
Adenovirus F40/41: NOT DETECTED
CAMPYLOBACTER SPECIES: NOT DETECTED
CYCLOSPORA CAYETANENSIS: NOT DETECTED
Cryptosporidium: NOT DETECTED
Entamoeba histolytica: NOT DETECTED
Enteroaggregative E coli (EAEC): NOT DETECTED
Enteropathogenic E coli (EPEC): NOT DETECTED
Enterotoxigenic E coli (ETEC): NOT DETECTED
Giardia lamblia: NOT DETECTED
Norovirus GI/GII: NOT DETECTED
Plesimonas shigelloides: NOT DETECTED
Rotavirus A: NOT DETECTED
SAPOVIRUS (I, II, IV, AND V): NOT DETECTED
SHIGA LIKE TOXIN PRODUCING E COLI (STEC): NOT DETECTED
Salmonella species: NOT DETECTED
Shigella/Enteroinvasive E coli (EIEC): NOT DETECTED
Vibrio cholerae: NOT DETECTED
Vibrio species: NOT DETECTED
Yersinia enterocolitica: NOT DETECTED

## 2018-09-27 LAB — CULTURE, RESPIRATORY W GRAM STAIN: Culture: NORMAL

## 2018-09-27 LAB — BASIC METABOLIC PANEL
Anion gap: 8 (ref 5–15)
BUN: 35 mg/dL — AB (ref 6–20)
CO2: 24 mmol/L (ref 22–32)
Calcium: 9 mg/dL (ref 8.9–10.3)
Chloride: 110 mmol/L (ref 98–111)
Creatinine, Ser: 1.09 mg/dL — ABNORMAL HIGH (ref 0.44–1.00)
GFR calc Af Amer: >60 mL/min (ref >60)
GFR calc non Af Amer: >60 mL/min (ref >60)
Glucose, Bld: 113 mg/dL — ABNORMAL HIGH (ref 70–99)
Potassium: 3.6 mmol/L (ref 3.5–5.1)
Sodium: 142 mmol/L (ref 135–145)

## 2018-09-27 LAB — C DIFFICILE QUICK SCREEN W PCR REFLEX
C Diff antigen: NEGATIVE
C Diff interpretation: NOT DETECTED
C Diff toxin: NEGATIVE

## 2018-09-27 MED ORDER — METOCLOPRAMIDE HCL 10 MG PO TABS: 10.0000 mg | ORAL_TABLET | Freq: Three times a day (TID) | ORAL | 1 refills | Status: DC | PRN

## 2018-09-27 MED ORDER — LOPERAMIDE HCL 2 MG PO CAPS: 2.0000 mg | ORAL_CAPSULE | Freq: Four times a day (QID) | ORAL | 0 refills | Status: DC | PRN

## 2018-09-27 MED ORDER — TOPIRAMATE 25 MG PO TABS
50.0000 mg | ORAL_TABLET | Freq: Three times a day (TID) | ORAL | Status: DC
  Administered 2018-09-27 – 2018-09-28 (×2): 50 mg via ORAL
  Filled 2018-09-27 (×7): qty 2

## 2018-09-27 MED ORDER — LOPERAMIDE HCL 2 MG PO CAPS
2.0000 mg | ORAL_CAPSULE | Freq: Four times a day (QID) | ORAL | Status: DC | PRN
  Administered 2018-09-27 (×2): 2 mg via ORAL
  Filled 2018-09-27 (×2): qty 1

## 2018-09-27 MED ORDER — DIVALPROEX SODIUM 250 MG PO DR TAB
250.0000 mg | DELAYED_RELEASE_TABLET | Freq: Three times a day (TID) | ORAL | Status: DC
  Administered 2018-09-27 – 2018-09-28 (×2): 250 mg via ORAL
  Filled 2018-09-27 (×7): qty 1

## 2018-09-27 NOTE — Care Management (Signed)
At time of this note, have not had return call by attending. Have not received return call from sheriff department to discuss coordination of law enforcement when patient does transfer via ems. Dr Cristal Generous has not entered her note as of yet that patient should remain under IVC during the transfer process. At present there still is no bed assignment or accepting physician. CM informed unit staff that without the ability to communicate/coordinate with law enforcement and ems transport, this transfer mostly likely will be able to occur tonight should a bed become available

## 2018-09-27 NOTE — Care Management (Signed)
CM was informed that [facility] has accepted patient for transfer: accepting physician and bed.  When unit was notified, it was relayed that at present there is not an accepting physician yet. CM found that patient is under IVC. It is not clear whether the transfer to [facility] is medial or for psych.  If it is for psych would need to remain under IVC and law enforcement would have to to be coordinated to follow transport ambulance.  Patient can not go by car. Spoke with the [place] resident that spoke with Robbie Lis CM and he relayed the transfer is probably "medical and psych".  CM tried to call Dr Rebecca Eaton (psych) to inquire about remaining under IVC.   She  is currently off duty and voice mail is full. Have reached out to attending and awaiting call back.  In the mean time, Dr Rebecca Eaton called back and  stated that patient should remain under IVC during transport process regardless if it is medical or psych.  CM awaiting call back from [facility]  Dept to discuss law enforcement needs during the transfer process even though patient will travel in ambulance.
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
## 2018-09-27 NOTE — Care Management Note (Signed)
Case Management Note  Patient Details  Name: Teionna Popke MRN: [id number] Date of Birth: [birth year]  Subjective/Objective:    [facility] this morning requesting updated [facility] information.  RNCM faxed most recent progress notes from yesterday to the [facility]. Robbie Lis RN BSN [phone]                  Action/Plan:   Expected Discharge Date:                  Expected Discharge Plan:     In-House Referral:     Discharge planning Services     Post Acute Care Choice:    Choice offered to:     DME Arranged:    DME Agency:     HH Arranged:    HH Agency:     Status of Service:     If discussed at Long Length of Stay Meetings, dates discussed:    Additional Comments:  Allayne Butcher, RN 09/27/2018, 2:01 PM
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
## 2018-09-27 NOTE — Progress Notes (Signed)
SLP Cancellation Note  Patient Details Name: Kylie Lam MRN: [id number] DOB: Jul 19, 1981   Cancelled treatment:       Reason Eval/Treat Not Completed: Fatigue/lethargy limiting ability to participate. Spoke w/ NSG staff and Neurology MD who stated pt was verbalizing intermittently, but w/ apparent paraphasias. Pt did help to feed self during meal, per NSG - no overt s/s of aspiration were noted during intake. ST to follow-up tomorrow, when more awake and alert to further assess pt's engagement in cognitive-linguistic tasks in evaluation. NSG updated and agreed.   Emogene Morgan, Graduate Student SLP 09/27/2018, 2:20 PM

## 2018-09-27 NOTE — Care Management (Signed)
Per Attending, patient should be medically stable for transfer to [facility] behavioral med within next 24 hours
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
## 2018-09-28 LAB — COMPREHENSIVE METABOLIC PANEL
ALT: 371 U/L — ABNORMAL HIGH (ref 0–44)
ANION GAP: 8 (ref 5–15)
AST: 77 U/L — ABNORMAL HIGH (ref 15–41)
Albumin: 3.4 g/dL — ABNORMAL LOW (ref 3.5–5.0)
Alkaline Phosphatase: 63 U/L (ref 38–126)
BUN: 28 mg/dL — ABNORMAL HIGH (ref 6–20)
CO2: 21 mmol/L — AB (ref 22–32)
Calcium: 8.9 mg/dL (ref 8.9–10.3)
Chloride: 110 mmol/L (ref 98–111)
Creatinine, Ser: 0.97 mg/dL (ref 0.44–1.00)
GFR calc Af Amer: >60 mL/min (ref >60)
GFR calc non Af Amer: >60 mL/min (ref >60)
GLUCOSE: 101 mg/dL — AB (ref 70–99)
Potassium: 3.7 mmol/L (ref 3.5–5.1)
SODIUM: 139 mmol/L (ref 135–145)
Total Bilirubin: 0.6 mg/dL (ref 0.3–1.2)
Total Protein: 6 g/dL — ABNORMAL LOW (ref 6.5–8.1)

## 2018-09-28 LAB — CBC
HCT: 37.9 % (ref 36.0–46.0)
Hemoglobin: 12.8 g/dL (ref 12.0–15.0)
MCH: 30.7 pg (ref 26.0–34.0)
MCHC: 33.8 g/dL (ref 30.0–36.0)
MCV: 90.9 fL (ref 80.0–100.0)
PLATELETS: 146 10*3/uL — AB (ref 150–400)
RBC: 4.17 MIL/uL (ref 3.87–5.11)
RDW: 12.1 % (ref 11.5–15.5)
WBC: 7.3 10*3/uL (ref 4.0–10.5)
nRBC: 0 % (ref 0.0–0.2)
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
## 2018-09-28 NOTE — Care Management Note (Signed)
Case Management Note  Patient Details  Name: Kylie Lam MRN: [id number] Date of Birth: [birth year]  Subjective/Objective:    RNCM received a call from the [facility] transfer center from [place].  Patient has been assigned a bed on 7B, report needs to be called from RN to RN number to call report is [phone] ext. [phone].  The patient will have a telemetry bed with a sitter for the IVC.  RNCM covering 1C updated on plan of care. Robbie Lis RN BSN [phone]                 Action/Plan:   Expected Discharge Date:  09/27/18               Expected Discharge Plan:     In-House Referral:     Discharge planning Services     Post Acute Care Choice:    Choice offered to:     DME Arranged:    DME Agency:     HH Arranged:    HH Agency:     Status of Service:     If discussed at [employer], dates discussed:    Additional Comments:  Allayne Butcher, RN 09/28/2018, 11:44 AM

## 2018-09-28 NOTE — Progress Notes (Signed)
Discharge order received in [facility] to discharge pt to the [facility] in [place], [place]; Care Management verbally advised that a hospital bed/transfer had been accepted to Room 7B; called report to [phone] to [facility] RN, no further questions voiced at this time; discharge pending arrival of [facility] and [place] [facility] for IVC transport to the [facility] in [place], [place]

## 2018-09-28 NOTE — Progress Notes (Signed)
Telephone call to the pt's father at the mobile number listed in the emergency contact tab; the pt's personal medications have been locked up in the [facility] pharmacy; asked if he wanted to come by and pick up her medications or send them with the [place] [facility] due to the IVC to the [facility]; send the medications with the Sheriff[facility] to the Hospital for them to keep
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
## 2018-09-28 NOTE — Progress Notes (Signed)
[facility] - [place] at [facility]   PATIENT NAME: Kylie Lam    MR#:  [id number]  DATE OF BIRTH:  June 18, 1981  SUBJECTIVE:  CHIEF COMPLAINT:   Chief Complaint  Patient presents with  . Altered Mental Status   Speaks very few words Sitter at bedside  REVIEW OF SYSTEMS:    Review of Systems  Unable to perform ROS: Mental status change   DRUG ALLERGIES:   Allergies  Allergen Reactions  . Baclofen Other (See Comments)    Dizzy, falling, can't control actions  . Haldol [Haloperidol Lactate] Other (See Comments)    Stiffness  . Prozac [Fluoxetine Hcl]     Redness all over body.    VITALS:  Blood pressure 136/85, pulse 65, temperature 98.5 F (36.9 C), temperature source Oral, resp. rate 16, height 5\' 9"  (1.753 m), weight 69.4 kg, SpO2 100 %.  PHYSICAL EXAMINATION:   Physical Exam  GENERAL:  38 y.o.-year-old patient lying in the bed .  EYES: Pupils equal, round, reactive to light and accommodation. No scleral icterus. Extraocular muscles intact.  HEENT: Head atraumatic, normocephalic.  NECK:  Supple, no jugular venous distention. No thyroid enlargement, no tenderness.  LUNGS: Normal breath sounds bilaterally, no wheezing, rales, rhonchi. No use of accessory muscles of respiration.  CARDIOVASCULAR: S1, S2 normal. No murmurs, rubs, or gallops.  ABDOMEN: Soft, nontender, nondistended. Bowel sounds present. No organomegaly or mass.  EXTREMITIES: No cyanosis, clubbing or edema b/l.    NEUROLOGIC: following instructions, generalized weakness. PSYCHIATRIC: The patient is speaking a few words. SKIN: No obvious rash, lesion, or ulcer.   LABORATORY PANEL:   CBC Recent Labs  Lab 09/28/18 0432  WBC 7.3  HGB 12.8  HCT 37.9  PLT 146*   ------------------------------------------------------------------------------------------------------------------ Chemistries  Recent Labs  Lab 09/25/18 0429  09/28/18 0432  NA 153*   < > 139  K 3.0*   < > 3.7  CL  119*   < > 110  CO2 24   < > 21*  GLUCOSE 130*   < > 101*  BUN 45*   < > 28*  CREATININE 1.66*   < > 0.97  CALCIUM 8.0*   < > 8.9  MG 2.7*  --   --   AST  --    < > 77*  ALT  --    < > 371*  ALKPHOS  --    < > 63  BILITOT  --    < > 0.6   < > = values in this interval not displayed.   ------------------------------------------------------------------------------------------------------------------  Cardiac Enzymes Recent Labs  Lab 09/23/18 2104  TROPONINI 0.88*   ------------------------------------------------------------------------------------------------------------------  RADIOLOGY:  Mr Brain Wo Contrast  Result Date: 09/27/2018 CLINICAL DATA:  Polysubstance abuse.  Overdose.  Ventilator support. EXAM: MRI HEAD WITHOUT CONTRAST TECHNIQUE: Multiplanar, multiecho pulse sequences of the brain and surrounding structures were obtained without intravenous contrast. COMPARISON:  CT 09/22/2018 FINDINGS: Brain: No true restricted diffusion to suggest infarction. 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 This can be seen following cocaine or amphetamine use. Minimal petechial blood products are present but there is no frank hematoma. No mass effect or midline shift. No hydrocephalus or extra-axial collection. Vascular: Major vessels at the base of the brain show flow. Skull and upper cervical spine: Negative Sinuses/Orbits: Clear/normal  Other: None IMPRESSION: Abnormal brain edema most prominent in the parieto-occipital cortical and subcortical brain and also affecting the cingulate gyri, right thalamus and posterior corpus callosum. 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 Mild petechial  blood products in scattered locations but no frank hematoma. Electronically Signed   By: Paulina Fusi M.D.   On: 09/27/2018 13:20   ASSESSMENT AND PLAN:   *Acute metabolic and toxic encephalopathy- PRES per MRI Urine drug screen positive for multiple drugs Likely has underlying psychiatric disease. Psychiatry following. Appreciate help They kept her on IVC and plan was to transfer to inpatient psych once medically stable.  * PRES   Per MRI   Called neuro consult- She may have seizures due to Multi drug used.   Cerrebral edema is likely due to that and reversible per neurology PA Webb Silversmith)   Advised to cont to monitor.   Spoke to pt's father and [place] physician- she is being transferred to [place]- waiting on bed.  * Vent dependent resp failure Extubated 09/25/2018- stable on room air now.  * Aspiration with no infiltrates on chest xray. Off abx now  * Shock liver Improving LFTs Seen by GI  * AKI  Resolved  *Elevated troponin:Demand ischemia and rhabdomyolysis causing elevation . Appreciate cardiology input. Heparin stopped  *Rhabdomyolysis Resolved with IVF  DVT prophylaxis:Heparin   All the records are reviewed and case discussed with Care Management/Social Worker Management plans discussed with the patient, family and they are in agreement.  CODE STATUS: FULL CODE  TOTAL  TIME TAKING CARE OF THIS PATIENT: 35 minutes.   Altamese Dilling M.D on 09/28/2018 at 3:58 PM  Between 7am to 6pm - Pager - [phone]  After 6pm go to [email or web address] - password [email or web address]  [facility] [place] [facility]  Office  [phone]  CC: Primary care physician; System, Pcp Not In  Note: This dictation was prepared with Dragon dictation along with smaller phrase technology. Any transcriptional errors that result from this process are unintentional.

## 2018-09-28 NOTE — Progress Notes (Signed)
[facility] Department and EMS personnel present for pt discharge; discharge packet including EMTALA form given to personnel; pt's personal medications, partial plate and watch given to [facility] personnel to take to the facility; pt discharged via stretcher by EMS and Sheriff's Dept representative to take to the [facility] in [place], [place]

## 2018-10-06 LAB — BLOOD GAS, VENOUS
Acid-base deficit: 5.7 mmol/L — ABNORMAL HIGH (ref 0.0–2.0)
Bicarbonate: 21.5 mmol/L (ref 20.0–28.0)
O2 Saturation: 23.5 %
Patient temperature: 37
pCO2, Ven: 48 mmHg (ref 44.0–60.0)
pH, Ven: 7.26 (ref 7.250–7.430)

## 2019-01-09 ENCOUNTER — Encounter: Payer: Self-pay | Admitting: Emergency Medicine

## 2019-01-09 ENCOUNTER — Ambulatory Visit
Admission: EM | Admit: 2019-01-09 | Discharge: 2019-01-09 | Disposition: A | Discharge status: Home / Self Care | Payer: Non-veteran care | Attending: Urgent Care | Admitting: Urgent Care

## 2019-01-09 ENCOUNTER — Other Ambulatory Visit: Payer: Self-pay

## 2019-01-09 DIAGNOSIS — G43019 Migraine without aura, intractable, without status migrainosus: Secondary | ICD-10-CM

## 2019-01-09 MED ORDER — DEXAMETHASONE SODIUM PHOSPHATE 10 MG/ML IJ SOLN
10.0000 mg | Freq: Once | INTRAMUSCULAR | Status: AC
  Administered 2019-01-09: 10 mg via INTRAVENOUS

## 2019-01-09 MED ORDER — KETOROLAC TROMETHAMINE 30 MG/ML IJ SOLN
30.0000 mg | Freq: Once | INTRAMUSCULAR | Status: AC
  Administered 2019-01-09: 30 mg via INTRAVENOUS

## 2019-01-09 MED ORDER — SODIUM CHLORIDE 0.9 % IV SOLN
Freq: Once | INTRAVENOUS | Status: AC
  Administered 2019-01-09: 14:00:00 via INTRAVENOUS

## 2019-01-09 NOTE — ED Triage Notes (Signed)
Patient c/o migraine headache, nausea, vomiting and shaking that started on Saturday.  Patient reports fever.  Patient denies cold symptoms.

## 2019-01-09 NOTE — Discharge Instructions (Signed)
It was very nice meeting you today in clinic. Thank you for entrusting me with your care.   May use Tylenol and/or Ibuprofen as needed for pain/fever. For severe pain, take Tylenol 500 mg and ibuprofen 400 mg TOGETHER. Increase fluid intake. Rest until feeling better.   Make arrangements to follow up with your regular doctor in 1 week for re-evaluation. If your symptoms/condition worsens, please seek follow up care either here or in the ER. Please remember, our [facility] providers are "right here with you" when you need [place].   Again, it was my pleasure to take care of you today. Thank you for choosing our clinic. I hope that you start to feel better quickly.   Quentin Mulling, MSN, APRN, FNP-C, CEN Advanced Practice Provider [facility]
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
## 2019-01-28 ENCOUNTER — Other Ambulatory Visit: Payer: Self-pay

## 2019-01-28 ENCOUNTER — Emergency Department: Payer: No Typology Code available for payment source

## 2019-01-28 ENCOUNTER — Emergency Department
Admission: EM | Admit: 2019-01-28 | Discharge: 2019-01-28 | Disposition: A | Discharge status: Home / Self Care | Payer: No Typology Code available for payment source | Attending: Emergency Medicine | Admitting: Emergency Medicine

## 2019-01-28 ENCOUNTER — Ambulatory Visit (INDEPENDENT_AMBULATORY_CARE_PROVIDER_SITE_OTHER): Payer: No Typology Code available for payment source

## 2019-01-28 ENCOUNTER — Encounter: Payer: Self-pay | Admitting: Emergency Medicine

## 2019-01-28 ENCOUNTER — Ambulatory Visit (INDEPENDENT_AMBULATORY_CARE_PROVIDER_SITE_OTHER)
Admission: EM | Admit: 2019-01-28 | Discharge: 2019-01-28 | Disposition: A | Discharge status: Nursing Home (Includes ICF) | Payer: No Typology Code available for payment source | Source: Home / Self Care

## 2019-01-28 DIAGNOSIS — R1010 Upper abdominal pain, unspecified: Secondary | ICD-10-CM | POA: Diagnosis not present

## 2019-01-28 DIAGNOSIS — Z8673 Personal history of transient ischemic attack (TIA), and cerebral infarction without residual deficits: Secondary | ICD-10-CM | POA: Diagnosis not present

## 2019-01-28 DIAGNOSIS — S2220XA Unspecified fracture of sternum, initial encounter for closed fracture: Secondary | ICD-10-CM | POA: Diagnosis not present

## 2019-01-28 DIAGNOSIS — Y929 Unspecified place or not applicable: Secondary | ICD-10-CM | POA: Diagnosis not present

## 2019-01-28 DIAGNOSIS — Y998 Other external cause status: Secondary | ICD-10-CM | POA: Insufficient documentation

## 2019-01-28 DIAGNOSIS — R0789 Other chest pain: Secondary | ICD-10-CM | POA: Diagnosis not present

## 2019-01-28 DIAGNOSIS — S2232XA Fracture of one rib, left side, initial encounter for closed fracture: Secondary | ICD-10-CM | POA: Diagnosis not present

## 2019-01-28 DIAGNOSIS — W5512XA Struck by horse, initial encounter: Secondary | ICD-10-CM | POA: Insufficient documentation

## 2019-01-28 DIAGNOSIS — Z9049 Acquired absence of other specified parts of digestive tract: Secondary | ICD-10-CM | POA: Insufficient documentation

## 2019-01-28 DIAGNOSIS — Y9389 Activity, other specified: Secondary | ICD-10-CM | POA: Insufficient documentation

## 2019-01-28 DIAGNOSIS — F17210 Nicotine dependence, cigarettes, uncomplicated: Secondary | ICD-10-CM | POA: Insufficient documentation

## 2019-01-28 DIAGNOSIS — S299XXA Unspecified injury of thorax, initial encounter: Secondary | ICD-10-CM | POA: Diagnosis not present

## 2019-01-28 DIAGNOSIS — Y9352 Activity, horseback riding: Secondary | ICD-10-CM | POA: Diagnosis not present

## 2019-01-28 LAB — COMPREHENSIVE METABOLIC PANEL
ALT: 38 U/L (ref 0–44)
AST: 27 U/L (ref 15–41)
Albumin: 4.3 g/dL (ref 3.5–5.0)
Alkaline Phosphatase: 102 U/L (ref 38–126)
Anion gap: 11 (ref 5–15)
BUN: 23 mg/dL — ABNORMAL HIGH (ref 6–20)
CO2: 27 mmol/L (ref 22–32)
Calcium: 9.1 mg/dL (ref 8.9–10.3)
Chloride: 102 mmol/L (ref 98–111)
Creatinine, Ser: 0.81 mg/dL (ref 0.44–1.00)
GFR calc Af Amer: >60 mL/min (ref >60)
GFR calc non Af Amer: >60 mL/min (ref >60)
Glucose, Bld: 100 mg/dL — ABNORMAL HIGH (ref 70–99)
Potassium: 4 mmol/L (ref 3.5–5.1)
Sodium: 140 mmol/L (ref 135–145)
Total Bilirubin: 0.2 mg/dL — ABNORMAL LOW (ref 0.3–1.2)
Total Protein: 7.5 g/dL (ref 6.5–8.1)

## 2019-01-28 LAB — CBC WITH DIFFERENTIAL/PLATELET
Abs Immature Granulocytes: 0.02 10*3/uL (ref 0.00–0.07)
Basophils Absolute: 0 10*3/uL (ref 0.0–0.1)
Basophils Relative: 0 %
Eosinophils Absolute: 0.1 10*3/uL (ref 0.0–0.5)
Eosinophils Relative: 1 %
HCT: 44.1 % (ref 36.0–46.0)
Hemoglobin: 14.3 g/dL (ref 12.0–15.0)
Immature Granulocytes: 0 %
Lymphocytes Relative: 33 %
Lymphs Abs: 2.2 10*3/uL (ref 0.7–4.0)
MCH: 30.8 pg (ref 26.0–34.0)
MCHC: 32.4 g/dL (ref 30.0–36.0)
MCV: 94.8 fL (ref 80.0–100.0)
Monocytes Absolute: 0.4 10*3/uL (ref 0.1–1.0)
Monocytes Relative: 7 %
Neutro Abs: 3.9 10*3/uL (ref 1.7–7.7)
Neutrophils Relative %: 59 %
Platelets: 200 10*3/uL (ref 150–400)
RBC: 4.65 MIL/uL (ref 3.87–5.11)
RDW: 12.3 % (ref 11.5–15.5)
WBC: 6.7 10*3/uL (ref 4.0–10.5)
nRBC: 0 % (ref 0.0–0.2)

## 2019-01-28 LAB — TROPONIN I: Troponin I: <0.03 ng/mL (ref <0.03)

## 2019-01-28 MED ORDER — IOHEXOL 300 MG/ML  SOLN
100.0000 mL | Freq: Once | INTRAMUSCULAR | Status: AC | PRN
  Administered 2019-01-28: 100 mL via INTRAVENOUS

## 2019-01-28 MED ORDER — DIAZEPAM 5 MG PO TABS: 5.0000 mg | ORAL_TABLET | Freq: Three times a day (TID) | ORAL | 0 refills | Status: DC | PRN

## 2019-01-28 MED ORDER — HYDROMORPHONE HCL 1 MG/ML IJ SOLN
1.0000 mg | Freq: Once | INTRAMUSCULAR | Status: AC
  Administered 2019-01-28: 1 mg via INTRAVENOUS
  Filled 2019-01-28: qty 1

## 2019-01-28 MED ORDER — OXYCODONE-ACETAMINOPHEN 5-325 MG PO TABS: 1.0000 | ORAL_TABLET | Freq: Three times a day (TID) | ORAL | 0 refills | Status: DC | PRN

## 2019-01-28 MED ORDER — LORAZEPAM 2 MG/ML IJ SOLN
1.0000 mg | Freq: Once | INTRAMUSCULAR | Status: AC
  Administered 2019-01-28: 1 mg via INTRAVENOUS
  Filled 2019-01-28: qty 1

## 2019-01-28 MED ORDER — OXYCODONE-ACETAMINOPHEN 5-325 MG PO TABS
2.0000 | ORAL_TABLET | Freq: Once | ORAL | Status: AC
  Administered 2019-01-28: 2 via ORAL
  Filled 2019-01-28: qty 2

## 2019-01-28 MED ORDER — DIAZEPAM 5 MG PO TABS
5.0000 mg | ORAL_TABLET | Freq: Once | ORAL | Status: AC
  Administered 2019-01-28: 5 mg via ORAL
  Filled 2019-01-28: qty 1
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
## 2019-01-28 NOTE — ED Notes (Signed)
Pt called out requesting something to eat and drink. Pt informed she cannot have anything until CT comes back.

## 2019-01-28 NOTE — ED Triage Notes (Signed)
Pt states yesterday she was riding a horse and was knocked off the horse and was kicked in her chest. States she had to be "rescitated" by EMS. Having severe chest pain where she was kicked.

## 2019-01-28 NOTE — ED Triage Notes (Signed)
Kicked by horse in chest yesterday, seen through [facility] today and diagnosed with sternal FX.  Sent to ED for admission.

## 2019-01-28 NOTE — Discharge Instructions (Addendum)
You need to go to [facility] ED for further care and testing. I have spoken with Dr. Jimmye Norman and they are expecting you.   Honor Loh, MSN, APRN, FNP-C, CEN Advanced Practice Provider [facility] 01/28/2019 4:18 PM

## 2019-01-28 NOTE — ED Notes (Signed)
Pt called out asking for more pain medication. Pt states that she is still having pain and that she does not fell like the medication she was given has helped. Per Dr. Jimmye Norman too soon for another dose of medication at this time. Pt was given a new ice pack.
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
## 2019-02-12 ENCOUNTER — Ambulatory Visit
Admission: EM | Admit: 2019-02-12 | Discharge: 2019-02-12 | Disposition: A | Discharge status: Home / Self Care | Payer: Non-veteran care | Attending: Family Medicine | Admitting: Family Medicine

## 2019-02-12 ENCOUNTER — Other Ambulatory Visit: Payer: Self-pay

## 2019-02-12 DIAGNOSIS — S2222XD Fracture of body of sternum, subsequent encounter for fracture with routine healing: Secondary | ICD-10-CM | POA: Diagnosis not present

## 2019-02-12 DIAGNOSIS — S2232XD Fracture of one rib, left side, subsequent encounter for fracture with routine healing: Secondary | ICD-10-CM

## 2019-02-12 DIAGNOSIS — S2232XA Fracture of one rib, left side, initial encounter for closed fracture: Secondary | ICD-10-CM

## 2019-02-12 DIAGNOSIS — Y9352 Activity, horseback riding: Secondary | ICD-10-CM

## 2019-02-12 DIAGNOSIS — S2222XA Fracture of body of sternum, initial encounter for closed fracture: Secondary | ICD-10-CM

## 2019-02-12 MED ORDER — OXYCODONE-ACETAMINOPHEN 5-325 MG PO TABS: 1.0000 | ORAL_TABLET | Freq: Four times a day (QID) | ORAL | 0 refills | Status: DC | PRN
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
## 2019-02-12 NOTE — ED Triage Notes (Signed)
Pt kicked by a horse 2 weeks ago, has broken sternum, was seen at [facility], reports increase in pain and difficulty getting deep breath R/T pain, has run out of medication

## 2019-02-12 NOTE — Discharge Instructions (Addendum)
Pain medication as directed.   If pain is uncontrolled, go to the hospital.  Take care  Dr. Lacinda Axon

## 2019-02-14 ENCOUNTER — Other Ambulatory Visit: Payer: Self-pay

## 2019-02-14 ENCOUNTER — Emergency Department
Admission: EM | Admit: 2019-02-14 | Discharge: 2019-02-14 | Disposition: A | Discharge status: Home / Self Care | Payer: No Typology Code available for payment source | Attending: Emergency Medicine | Admitting: Emergency Medicine

## 2019-02-14 DIAGNOSIS — Z8673 Personal history of transient ischemic attack (TIA), and cerebral infarction without residual deficits: Secondary | ICD-10-CM | POA: Insufficient documentation

## 2019-02-14 DIAGNOSIS — Z79899 Other long term (current) drug therapy: Secondary | ICD-10-CM | POA: Insufficient documentation

## 2019-02-14 DIAGNOSIS — F17210 Nicotine dependence, cigarettes, uncomplicated: Secondary | ICD-10-CM | POA: Diagnosis not present

## 2019-02-14 DIAGNOSIS — W5512XD Struck by horse, subsequent encounter: Secondary | ICD-10-CM | POA: Diagnosis not present

## 2019-02-14 DIAGNOSIS — S2222XG Fracture of body of sternum, subsequent encounter for fracture with delayed healing: Secondary | ICD-10-CM | POA: Diagnosis not present

## 2019-02-14 DIAGNOSIS — J45909 Unspecified asthma, uncomplicated: Secondary | ICD-10-CM | POA: Insufficient documentation

## 2019-02-14 MED ORDER — LIDOCAINE 5 % EX PTCH
1.0000 | MEDICATED_PATCH | CUTANEOUS | Status: DC
  Administered 2019-02-14: 15:00:00 1 via TRANSDERMAL
  Filled 2019-02-14: qty 1

## 2019-02-14 MED ORDER — LIDOCAINE 5 % EX PTCH: 1.0000 | MEDICATED_PATCH | Freq: Two times a day (BID) | CUTANEOUS | 1 refills | Status: DC

## 2019-02-14 MED ORDER — OXYCODONE-ACETAMINOPHEN 7.5-325 MG PO TABS: 1.0000 | ORAL_TABLET | Freq: Four times a day (QID) | ORAL | 0 refills | Status: DC | PRN
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
## 2019-02-14 NOTE — ED Notes (Signed)
Pt transported to room in wheel chair with ice pack on chest. Pt reports broken ribs and sternum fx. Ambulatory to restroom after pt was placed in room.

## 2019-02-14 NOTE — ED Triage Notes (Signed)
Pt states she was kicked in the chest by a horse about 3 weeks ago and was seen here but continues to have pain and her PCP in on vacation.

## 2019-02-20 ENCOUNTER — Encounter: Payer: Self-pay | Admitting: *Deleted

## 2019-02-20 ENCOUNTER — Other Ambulatory Visit: Payer: Self-pay

## 2019-02-20 ENCOUNTER — Emergency Department
Admission: EM | Admit: 2019-02-20 | Discharge: 2019-02-20 | Disposition: A | Discharge status: Home / Self Care | Payer: No Typology Code available for payment source | Attending: Emergency Medicine | Admitting: Emergency Medicine

## 2019-02-20 DIAGNOSIS — R0789 Other chest pain: Secondary | ICD-10-CM | POA: Diagnosis present

## 2019-02-20 DIAGNOSIS — W5512XD Struck by horse, subsequent encounter: Secondary | ICD-10-CM | POA: Insufficient documentation

## 2019-02-20 DIAGNOSIS — Z79899 Other long term (current) drug therapy: Secondary | ICD-10-CM | POA: Insufficient documentation

## 2019-02-20 DIAGNOSIS — F17210 Nicotine dependence, cigarettes, uncomplicated: Secondary | ICD-10-CM | POA: Insufficient documentation

## 2019-02-20 DIAGNOSIS — S2231XD Fracture of one rib, right side, subsequent encounter for fracture with routine healing: Secondary | ICD-10-CM | POA: Insufficient documentation

## 2019-02-20 DIAGNOSIS — J45909 Unspecified asthma, uncomplicated: Secondary | ICD-10-CM | POA: Diagnosis not present

## 2019-02-20 DIAGNOSIS — S2222XD Fracture of body of sternum, subsequent encounter for fracture with routine healing: Secondary | ICD-10-CM | POA: Insufficient documentation

## 2019-02-20 MED ORDER — OXYCODONE-ACETAMINOPHEN 5-325 MG PO TABS
1.0000 | ORAL_TABLET | Freq: Once | ORAL | Status: AC
  Administered 2019-02-20: 14:00:00 1 via ORAL
  Filled 2019-02-20: qty 1

## 2019-02-20 MED ORDER — KETOROLAC TROMETHAMINE 10 MG PO TABS: 10.0000 mg | ORAL_TABLET | Freq: Four times a day (QID) | ORAL | 0 refills | Status: DC | PRN

## 2019-02-20 MED ORDER — OXYCODONE-ACETAMINOPHEN 5-325 MG PO TABS: 1.0000 | ORAL_TABLET | Freq: Three times a day (TID) | ORAL | 0 refills | Status: AC | PRN

## 2019-02-20 MED ORDER — KETOROLAC TROMETHAMINE 30 MG/ML IJ SOLN
30.0000 mg | Freq: Once | INTRAMUSCULAR | Status: AC
  Administered 2019-02-20: 30 mg via INTRAMUSCULAR
  Filled 2019-02-20: qty 1

## 2019-02-20 NOTE — ED Triage Notes (Signed)
Continued pain from rib fractures and fractured sternum. PT reporting her [place] doctor is out of town and can not prescribe more medication. Pt requesting pain medication.
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
## 2019-02-20 NOTE — ED Notes (Signed)
See triage note  Presents with cont'd pain to chest  States she was kicked by horse and was dx'd with fx' sternum  Unable to f/u with her pcp until next week

## 2019-03-19 ENCOUNTER — Emergency Department
Admission: EM | Admit: 2019-03-19 | Discharge: 2019-03-19 | Disposition: A | Discharge status: Home / Self Care | Payer: No Typology Code available for payment source | Attending: Student in an Organized Health Care Education/Training Program | Admitting: Student in an Organized Health Care Education/Training Program

## 2019-03-19 ENCOUNTER — Encounter: Payer: Self-pay | Admitting: Emergency Medicine

## 2019-03-19 ENCOUNTER — Ambulatory Visit (INDEPENDENT_AMBULATORY_CARE_PROVIDER_SITE_OTHER)
Admission: EM | Admit: 2019-03-19 | Discharge: 2019-03-19 | Disposition: A | Discharge status: Home / Self Care | Payer: No Typology Code available for payment source | Source: Home / Self Care | Attending: Family Medicine | Admitting: Family Medicine

## 2019-03-19 ENCOUNTER — Emergency Department: Payer: No Typology Code available for payment source

## 2019-03-19 ENCOUNTER — Other Ambulatory Visit: Payer: Self-pay

## 2019-03-19 DIAGNOSIS — R102 Pelvic and perineal pain: Secondary | ICD-10-CM | POA: Diagnosis not present

## 2019-03-19 DIAGNOSIS — J45909 Unspecified asthma, uncomplicated: Secondary | ICD-10-CM | POA: Insufficient documentation

## 2019-03-19 DIAGNOSIS — Z79899 Other long term (current) drug therapy: Secondary | ICD-10-CM | POA: Insufficient documentation

## 2019-03-19 DIAGNOSIS — F17210 Nicotine dependence, cigarettes, uncomplicated: Secondary | ICD-10-CM | POA: Diagnosis not present

## 2019-03-19 DIAGNOSIS — R1030 Lower abdominal pain, unspecified: Secondary | ICD-10-CM | POA: Diagnosis not present

## 2019-03-19 DIAGNOSIS — N939 Abnormal uterine and vaginal bleeding, unspecified: Secondary | ICD-10-CM

## 2019-03-19 DIAGNOSIS — N3090 Cystitis, unspecified without hematuria: Secondary | ICD-10-CM | POA: Insufficient documentation

## 2019-03-19 LAB — URINALYSIS, COMPLETE (UACMP) WITH MICROSCOPIC
Bacteria, UA: NONE SEEN
Bilirubin Urine: NEGATIVE
Glucose, UA: 100 mg/dL — AB
Glucose, UA: NEGATIVE mg/dL
Ketones, ur: 15 mg/dL — AB
Ketones, ur: NEGATIVE mg/dL
Nitrite: POSITIVE — AB
Nitrite: NEGATIVE
Protein, ur: >300 mg/dL — AB
Protein, ur: 100 mg/dL — AB
RBC / HPF: >50 RBC/hpf (ref 0–5)
RBC / HPF: >50 RBC/hpf — ABNORMAL HIGH (ref 0–5)
Specific Gravity, Urine: 1.025 (ref 1.005–1.030)
Specific Gravity, Urine: 1.028 (ref 1.005–1.030)
Squamous Epithelial / HPF: NONE SEEN (ref 0–5)
Squamous Epithelial / LPF: NONE SEEN (ref 0–5)
WBC, UA: >50 WBC/hpf — ABNORMAL HIGH (ref 0–5)
pH: 5 (ref 5.0–8.0)
pH: 5 (ref 5.0–8.0)

## 2019-03-19 LAB — LIPASE, BLOOD: Lipase: 30 U/L (ref 11–51)

## 2019-03-19 LAB — COMPREHENSIVE METABOLIC PANEL
ALT: 32 U/L (ref 0–44)
AST: 27 U/L (ref 15–41)
Albumin: 4.7 g/dL (ref 3.5–5.0)
Alkaline Phosphatase: 85 U/L (ref 38–126)
Anion gap: 13 (ref 5–15)
BUN: 21 mg/dL — ABNORMAL HIGH (ref 6–20)
CO2: 24 mmol/L (ref 22–32)
Calcium: 9.3 mg/dL (ref 8.9–10.3)
Chloride: 99 mmol/L (ref 98–111)
Creatinine, Ser: 1.02 mg/dL — ABNORMAL HIGH (ref 0.44–1.00)
GFR calc Af Amer: >60 mL/min (ref >60)
GFR calc non Af Amer: >60 mL/min (ref >60)
Glucose, Bld: 103 mg/dL — ABNORMAL HIGH (ref 70–99)
Potassium: 3.9 mmol/L (ref 3.5–5.1)
Sodium: 136 mmol/L (ref 135–145)
Total Bilirubin: 0.7 mg/dL (ref 0.3–1.2)
Total Protein: 7.7 g/dL (ref 6.5–8.1)

## 2019-03-19 LAB — CBC
HCT: 42.8 % (ref 36.0–46.0)
Hemoglobin: 14.5 g/dL (ref 12.0–15.0)
MCH: 31.3 pg (ref 26.0–34.0)
MCHC: 33.9 g/dL (ref 30.0–36.0)
MCV: 92.2 fL (ref 80.0–100.0)
Platelets: 168 10*3/uL (ref 150–400)
RBC: 4.64 MIL/uL (ref 3.87–5.11)
RDW: 12.7 % (ref 11.5–15.5)
WBC: 17.4 10*3/uL — ABNORMAL HIGH (ref 4.0–10.5)
nRBC: 0 % (ref 0.0–0.2)

## 2019-03-19 MED ORDER — MORPHINE SULFATE (PF) 4 MG/ML IV SOLN
4.0000 mg | INTRAVENOUS | Status: DC | PRN
  Administered 2019-03-19: 4 mg via INTRAVENOUS
  Filled 2019-03-19: qty 1

## 2019-03-19 MED ORDER — IOHEXOL 300 MG/ML  SOLN
100.0000 mL | Freq: Once | INTRAMUSCULAR | Status: AC | PRN
  Administered 2019-03-19: 100 mL via INTRAVENOUS
  Filled 2019-03-19: qty 100

## 2019-03-19 MED ORDER — HYDROCODONE-ACETAMINOPHEN 5-325 MG PO TABS
1.0000 | ORAL_TABLET | Freq: Once | ORAL | Status: AC
  Administered 2019-03-19: 1 via ORAL
  Filled 2019-03-19: qty 1

## 2019-03-19 MED ORDER — KETOROLAC TROMETHAMINE 30 MG/ML IJ SOLN
15.0000 mg | Freq: Once | INTRAMUSCULAR | Status: AC
  Administered 2019-03-19: 15 mg via INTRAVENOUS
  Filled 2019-03-19: qty 1

## 2019-03-19 MED ORDER — SODIUM CHLORIDE 0.9% FLUSH
3.0000 mL | Freq: Once | INTRAVENOUS | Status: AC
  Administered 2019-03-19: 3 mL via INTRAVENOUS

## 2019-03-19 MED ORDER — CEPHALEXIN 500 MG PO CAPS: 500.0000 mg | ORAL_CAPSULE | Freq: Three times a day (TID) | ORAL | 0 refills | Status: AC

## 2019-03-19 MED ORDER — ONDANSETRON HCL 4 MG/2ML IJ SOLN
4.0000 mg | Freq: Once | INTRAMUSCULAR | Status: AC
  Administered 2019-03-19: 4 mg via INTRAVENOUS
  Filled 2019-03-19: qty 2

## 2019-03-19 MED ORDER — HYDROCODONE-ACETAMINOPHEN 5-325 MG PO TABS: 1.0000 | ORAL_TABLET | ORAL | 0 refills | Status: DC | PRN

## 2019-03-19 MED ORDER — SODIUM CHLORIDE 0.9 % IV SOLN
1.0000 g | Freq: Once | INTRAVENOUS | Status: AC
  Administered 2019-03-19: 1 g via INTRAVENOUS
  Filled 2019-03-19 (×2): qty 10
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
## 2019-03-19 NOTE — ED Triage Notes (Addendum)
Sent for ED evaluation from [place].  Patient c/o severe lower abdominal pain x 1 day and also vaginal bleeding x 5 hours.  Also c/o urinary frequency and feeling as if she is unable to empty bladder since this morning.  Patient had a Hysterectomy in 2004.

## 2019-03-19 NOTE — Discharge Instructions (Addendum)

## 2019-03-19 NOTE — ED Notes (Signed)
Patient transported to CT 

## 2019-03-19 NOTE — Discharge Instructions (Signed)
Go directly to the emergency room

## 2019-03-19 NOTE — ED Triage Notes (Signed)
Patient states she is having very bad abdominal pain.  Patient had a hysterectomy several years ago.  Woke this morning and is bleeding as though she is having a period.

## 2019-03-20 ENCOUNTER — Telehealth: Payer: Self-pay | Admitting: Obstetrics and Gynecology

## 2019-03-20 NOTE — Telephone Encounter (Signed)
[facility] ED faxed ED f/u referral. Checked ED visit/ [facility] provider did not see pt in ED. Thank you.

## 2019-08-14 ENCOUNTER — Ambulatory Visit
Admission: EM | Admit: 2019-08-14 | Discharge: 2019-08-14 | Disposition: A | Discharge status: Home / Self Care | Payer: Non-veteran care | Attending: Family Medicine | Admitting: Family Medicine

## 2019-08-14 ENCOUNTER — Other Ambulatory Visit: Payer: Self-pay

## 2019-08-14 ENCOUNTER — Encounter: Payer: Self-pay | Admitting: Emergency Medicine

## 2019-08-14 DIAGNOSIS — M545 Low back pain, unspecified: Secondary | ICD-10-CM

## 2019-08-14 DIAGNOSIS — W182XXA Fall in (into) shower or empty bathtub, initial encounter: Secondary | ICD-10-CM

## 2019-08-14 MED ORDER — TIZANIDINE HCL 4 MG PO TABS: 4.0000 mg | ORAL_TABLET | Freq: Four times a day (QID) | ORAL | 0 refills | Status: DC | PRN

## 2019-08-14 MED ORDER — KETOROLAC TROMETHAMINE 10 MG PO TABS: 10.0000 mg | ORAL_TABLET | Freq: Four times a day (QID) | ORAL | 0 refills | Status: DC | PRN
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
## 2019-08-14 NOTE — Discharge Instructions (Signed)
Rest.  Heat.  Medication as directed.  Take care  Dr. Eily Louvier  

## 2019-08-14 NOTE — ED Triage Notes (Signed)
Pt c/o lower back pain. She states that she slipped and fell in the shower 2 days ago and since then she has been able to stand up straight. She has tried ice, heat and ibuprofen without relief.

## 2019-12-13 ENCOUNTER — Encounter: Payer: Self-pay | Admitting: Emergency Medicine

## 2019-12-13 ENCOUNTER — Other Ambulatory Visit: Payer: Self-pay

## 2019-12-13 ENCOUNTER — Ambulatory Visit
Admission: EM | Admit: 2019-12-13 | Discharge: 2019-12-13 | Disposition: A | Discharge status: Home / Self Care | Payer: No Typology Code available for payment source | Attending: Emergency Medicine | Admitting: Emergency Medicine

## 2019-12-13 DIAGNOSIS — F17210 Nicotine dependence, cigarettes, uncomplicated: Secondary | ICD-10-CM | POA: Diagnosis not present

## 2019-12-13 DIAGNOSIS — Z113 Encounter for screening for infections with a predominantly sexual mode of transmission: Secondary | ICD-10-CM | POA: Insufficient documentation

## 2019-12-13 DIAGNOSIS — T7621XA Adult sexual abuse, suspected, initial encounter: Secondary | ICD-10-CM | POA: Diagnosis present

## 2019-12-13 DIAGNOSIS — Z0441 Encounter for examination and observation following alleged adult rape: Secondary | ICD-10-CM | POA: Insufficient documentation

## 2019-12-13 MED ORDER — AZITHROMYCIN 500 MG PO TABS
1000.0000 mg | ORAL_TABLET | Freq: Once | ORAL | Status: AC
  Administered 2019-12-13: 16:00:00 1000 mg via ORAL
  Filled 2019-12-13: qty 2

## 2019-12-13 MED ORDER — CEFTRIAXONE SODIUM 250 MG IJ SOLR
250.0000 mg | Freq: Once | INTRAMUSCULAR | Status: AC
  Administered 2019-12-13: 250 mg via INTRAMUSCULAR
  Filled 2019-12-13: qty 250

## 2019-12-13 MED ORDER — LIDOCAINE HCL (PF) 1 % IJ SOLN
0.9000 mL | Freq: Once | INTRAMUSCULAR | Status: AC
  Administered 2019-12-13: 16:00:00 0.9 mL
  Filled 2019-12-13: qty 5

## 2019-12-13 MED ORDER — METRONIDAZOLE 500 MG PO TABS
2000.0000 mg | ORAL_TABLET | Freq: Once | ORAL | Status: AC
  Administered 2019-12-13: 16:00:00 2000 mg via ORAL
  Filled 2019-12-13: qty 4

## 2019-12-13 MED ORDER — PROMETHAZINE HCL 25 MG PO TABS
25.0000 mg | ORAL_TABLET | Freq: Four times a day (QID) | ORAL | Status: DC | PRN
  Administered 2019-12-13: 16:00:00 25 mg via ORAL
  Filled 2019-12-13: qty 1
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
## 2019-12-13 NOTE — Discharge Instructions (Signed)
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 abnormalities as in Parkinson's disease, or for gastrointestinal problems  epinephrine  medicines for allergies or colds  muscle relaxants  narcotic medicines for pain  other medicines that prolong the QT interval (cause an abnormal heart rhythm) like dofetilide, ziprasidone  tramadol  trimethobenzamide This list may not describe all possible interactions. Give your health care provider a list of all the medicines, herbs, non-prescription drugs, or dietary supplements you use. Also tell them if you smoke, drink alcohol, or use illegal drugs. Some items may interact with your medicine. What should I watch for while using this medicine? Tell your doctor or health care professional if your symptoms do not start  to get better in 1 to 2 days. You may get drowsy or dizzy. Do not drive, use machinery, or do anything that needs mental alertness until you know how this medicine affects you. To reduce the risk of dizzy or fainting spells, do not stand or sit up quickly, especially if you are an older patient. Alcohol may increase dizziness and drowsiness. Avoid alcoholic drinks. Your mouth may get dry. Chewing sugarless gum or sucking hard candy, and drinking plenty of water may help. Contact your doctor if the problem does not go away or is severe. This medicine may cause dry eyes and blurred vision. If you wear contact lenses you may feel some discomfort. Lubricating drops may help. See your eye doctor if the problem does not go away or is severe. This medicine can make you more sensitive to the sun. Keep out of the sun. If you cannot avoid being in the sun, wear protective clothing and use sunscreen. Do not use sun lamps or tanning beds/booths. If you are diabetic, check your blood-sugar levels regularly. What side effects may I notice from receiving this medicine? Side effects that you should report to your doctor or health care professional as soon as possible:  blurred vision  irregular heartbeat, palpitations or chest pain  muscle or facial twitches  pain or difficulty passing urine  seizures  skin rash  slowed or shallow breathing  unusual bleeding or bruising  yellowing of the eyes or skin Side effects that usually do not require medical attention (report to your doctor or health care professional if they continue or are bothersome):  headache  nightmares, agitation, nervousness, excitability, not able to sleep (these are more likely in children)  stuffy nose This list may not describe all possible side effects. Call your doctor for medical advice about side effects. You may report side effects to FDA at 1-800-FDA-1088. Where should I keep my medicine? Keep out of the reach of  children. Store at room temperature, between 20 and 25 degrees C (68 and 77 degrees F). Protect from light. Throw away any unused medicine after the expiration date. NOTE: This sheet is a summary. It may not cover all possible information. If you have questions about this medicine, talk to your doctor, pharmacist, or health care provider.  2020 Elsevier/Gold Standard (2018-07-18 08:46:17)   Ceftriaxone (Injection/Shot) Also known as:  Rocephin  Ceftriaxone injection What is this medicine? CEFTRIAXONE (sef try AX one) is a cephalosporin antibiotic. It is used to treat certain kinds  of bacterial infections. It will not work for colds, flu, or other viral infections. This medicine may be used for other purposes; ask your health care provider or pharmacist if you have questions. COMMON BRAND NAME(S): Ceftrisol Plus, Rocephin What should I tell my health care provider before I take this medicine? They need to know if you have any of these conditions:  any chronic illness  bowel disease, like colitis  both kidney and liver disease  high bilirubin level in newborn patients  an unusual or allergic reaction to ceftriaxone, other cephalosporin or penicillin antibiotics, foods, dyes, or preservatives  pregnant or trying to get pregnant  breast-feeding How should I use this medicine? This medicine is injected into a muscle or infused it into a vein. It is usually given in a medical office or clinic. If you are to give this medicine you will be taught how to inject it. Follow instructions carefully. Use your doses at regular intervals. Do not take your medicine more often than directed. Do not skip doses or stop your medicine early even if you feel better. Do not stop taking except on your doctor's advice. Talk to your pediatrician regarding the use of this medicine in children. Special care may be needed. Overdosage: If you think you have taken too much of this medicine contact a poison control  center or emergency room at once. NOTE: This medicine is only for you. Do not share this medicine with others. What if I miss a dose? If you miss a dose, take it as soon as you can. If it is almost time for your next dose, take only that dose. Do not take double or extra doses. What may interact with this medicine? Do not take this medicine with any of the following medications:  intravenous calcium This medicine may also interact with the following medications:  birth control pills This list may not describe all possible interactions. Give your health care provider a list of all the medicines, herbs, non-prescription drugs, or dietary supplements you use. Also tell them if you smoke, drink alcohol, or use illegal drugs. Some items may interact with your medicine. What should I watch for while using this medicine? Tell your doctor or health care provider if your symptoms do not improve or if they get worse. This medicine may cause serious skin reactions. They can happen weeks to months after starting the medicine. Contact your health care provider right away if you notice fevers or flu-like symptoms with a rash. The rash may be red or purple and then turn into blisters or peeling of the skin. Or, you might notice a red rash with swelling of the face, lips or lymph nodes in your neck or under your arms. Do not treat diarrhea with over the counter products. Contact your doctor if you have diarrhea that lasts more than 2 days or if it is severe and watery. If you are being treated for a sexually transmitted disease, avoid sexual contact until you have finished your treatment. Having sex can infect your sexual partner. Calcium may bind to this medicine and cause lung or kidney problems. Avoid calcium products while taking this medicine and for 48 hours after taking the last dose of this medicine. What side effects may I notice from receiving this medicine? Side effects that you should report to your  doctor or health care professional as soon as possible:  allergic reactions like skin rash, itching or hives, swelling of the face, lips, or tongue  breathing  problems  fever, chills  irregular heartbeat  pain when passing urine  redness, blistering, peeling, or loosening of the skin, including inside the mouth  seizures  stomach pain, cramps  unusual bleeding, bruising  unusually weak or tired Side effects that usually do not require medical attention (report to your doctor or health care professional if they continue or are bothersome):  diarrhea  dizzy, drowsy  headache  nausea, vomiting  pain, swelling, irritation where injected  stomach upset  sweating This list may not describe all possible side effects. Call your doctor for medical advice about side effects. You may report side effects to FDA at 1-800-FDA-1088. Where should I keep my medicine? Keep out of the reach of children. Store at room temperature below 25 degrees C (77 degrees F). Protect from light. Throw away any unused vials after the expiration date. NOTE: This sheet is a summary. It may not cover all possible information. If you have questions about this medicine, talk to your doctor, pharmacist, or health care provider.  2020 Elsevier/Gold Standard (2018-10-27 10:10:06)
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
## 2019-12-14 ENCOUNTER — Emergency Department
Admission: EM | Admit: 2019-12-14 | Discharge: 2019-12-15 | Disposition: A | Discharge status: Home / Self Care | Payer: No Typology Code available for payment source | Attending: Student in an Organized Health Care Education/Training Program | Admitting: Student in an Organized Health Care Education/Training Program

## 2019-12-14 DIAGNOSIS — T7421XA Adult sexual abuse, confirmed, initial encounter: Secondary | ICD-10-CM

## 2019-12-15 NOTE — ED Notes (Signed)
Pt discharged off board by this RN, pain scale marked as zero to clear pt from board.
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
## 2020-05-17 ENCOUNTER — Ambulatory Visit
Admission: EM | Admit: 2020-05-17 | Discharge: 2020-05-17 | Disposition: A | Discharge status: Home / Self Care | Payer: Non-veteran care | Attending: Family Medicine | Admitting: Family Medicine

## 2020-05-17 ENCOUNTER — Other Ambulatory Visit: Payer: Self-pay

## 2020-05-17 DIAGNOSIS — L02412 Cutaneous abscess of left axilla: Secondary | ICD-10-CM

## 2020-05-17 MED ORDER — DOXYCYCLINE HYCLATE 100 MG PO CAPS: 100.0000 mg | ORAL_CAPSULE | Freq: Two times a day (BID) | ORAL | 0 refills | Status: DC

## 2020-05-17 NOTE — Discharge Instructions (Addendum)
Medication as prescribed.  Tylenol and/or Ibuprofen as needed. Should be improved with procedure.  Remove pack in 48 hours.  If any concerns, please let us know.  Take care  Dr. Adriana Simas

## 2020-05-17 NOTE — ED Triage Notes (Signed)
Patient complains of abscess under left arm pit. Patient states that area has been painful and growing. States that she noticed this 1 month ago and popped it originally.
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
## 2020-07-20 ENCOUNTER — Encounter: Payer: Self-pay | Admitting: Emergency Medicine

## 2020-07-20 ENCOUNTER — Inpatient Hospital Stay: Payer: No Typology Code available for payment source | Admitting: Anesthesiology

## 2020-07-20 ENCOUNTER — Other Ambulatory Visit: Payer: Self-pay

## 2020-07-20 ENCOUNTER — Inpatient Hospital Stay
Admission: EM | Admit: 2020-07-20 | Discharge: 2020-07-21 | DRG: 502 | Disposition: A | Discharge status: Home / Self Care | Payer: No Typology Code available for payment source | Attending: Specialist | Admitting: Specialist

## 2020-07-20 ENCOUNTER — Emergency Department: Payer: No Typology Code available for payment source

## 2020-07-20 ENCOUNTER — Encounter
Admission: EM | Disposition: A | Discharge status: Home / Self Care | Payer: Self-pay | Source: Home / Self Care | Attending: Specialist

## 2020-07-20 ENCOUNTER — Inpatient Hospital Stay: Payer: No Typology Code available for payment source

## 2020-07-20 DIAGNOSIS — Z818 Family history of other mental and behavioral disorders: Secondary | ICD-10-CM

## 2020-07-20 DIAGNOSIS — Z9049 Acquired absence of other specified parts of digestive tract: Secondary | ICD-10-CM

## 2020-07-20 DIAGNOSIS — Z888 Allergy status to other drugs, medicaments and biological substances status: Secondary | ICD-10-CM | POA: Diagnosis not present

## 2020-07-20 DIAGNOSIS — F419 Anxiety disorder, unspecified: Secondary | ICD-10-CM | POA: Diagnosis present

## 2020-07-20 DIAGNOSIS — S82042A Displaced comminuted fracture of left patella, initial encounter for closed fracture: Secondary | ICD-10-CM | POA: Diagnosis present

## 2020-07-20 DIAGNOSIS — F4310 Post-traumatic stress disorder, unspecified: Secondary | ICD-10-CM | POA: Diagnosis present

## 2020-07-20 DIAGNOSIS — F32A Depression, unspecified: Secondary | ICD-10-CM | POA: Diagnosis present

## 2020-07-20 DIAGNOSIS — F17210 Nicotine dependence, cigarettes, uncomplicated: Secondary | ICD-10-CM | POA: Diagnosis present

## 2020-07-20 DIAGNOSIS — Z79899 Other long term (current) drug therapy: Secondary | ICD-10-CM

## 2020-07-20 DIAGNOSIS — J45909 Unspecified asthma, uncomplicated: Secondary | ICD-10-CM | POA: Diagnosis present

## 2020-07-20 DIAGNOSIS — K219 Gastro-esophageal reflux disease without esophagitis: Secondary | ICD-10-CM | POA: Diagnosis present

## 2020-07-20 DIAGNOSIS — M25562 Pain in left knee: Secondary | ICD-10-CM

## 2020-07-20 DIAGNOSIS — Z90710 Acquired absence of both cervix and uterus: Secondary | ICD-10-CM | POA: Diagnosis not present

## 2020-07-20 DIAGNOSIS — G479 Sleep disorder, unspecified: Secondary | ICD-10-CM | POA: Diagnosis present

## 2020-07-20 DIAGNOSIS — K589 Irritable bowel syndrome without diarrhea: Secondary | ICD-10-CM | POA: Diagnosis present

## 2020-07-20 DIAGNOSIS — Z8673 Personal history of transient ischemic attack (TIA), and cerebral infarction without residual deficits: Secondary | ICD-10-CM

## 2020-07-20 DIAGNOSIS — W19XXXA Unspecified fall, initial encounter: Secondary | ICD-10-CM | POA: Diagnosis present

## 2020-07-20 DIAGNOSIS — S82092A Other fracture of left patella, initial encounter for closed fracture: Secondary | ICD-10-CM

## 2020-07-20 DIAGNOSIS — S82012A Displaced osteochondral fracture of left patella, initial encounter for closed fracture: Principal | ICD-10-CM | POA: Diagnosis present

## 2020-07-20 DIAGNOSIS — Z9151 Personal history of suicidal behavior: Secondary | ICD-10-CM | POA: Diagnosis not present

## 2020-07-20 DIAGNOSIS — Z20822 Contact with and (suspected) exposure to covid-19: Secondary | ICD-10-CM | POA: Diagnosis present

## 2020-07-20 HISTORY — PX: ORIF PATELLA: SHX5033

## 2020-07-20 LAB — CBC
HCT: 42.7 % (ref 36.0–46.0)
Hemoglobin: 14.1 g/dL (ref 12.0–15.0)
MCH: 29.8 pg (ref 26.0–34.0)
MCHC: 33 g/dL (ref 30.0–36.0)
MCV: 90.3 fL (ref 80.0–100.0)
Platelets: 267 10*3/uL (ref 150–400)
RBC: 4.73 MIL/uL (ref 3.87–5.11)
RDW: 13 % (ref 11.5–15.5)
WBC: 11.1 10*3/uL — ABNORMAL HIGH (ref 4.0–10.5)
nRBC: 0 % (ref 0.0–0.2)

## 2020-07-20 LAB — BASIC METABOLIC PANEL
Anion gap: 10 (ref 5–15)
BUN: 20 mg/dL (ref 6–20)
CO2: 22 mmol/L (ref 22–32)
Calcium: 9.4 mg/dL (ref 8.9–10.3)
Chloride: 104 mmol/L (ref 98–111)
Creatinine, Ser: 1.07 mg/dL — ABNORMAL HIGH (ref 0.44–1.00)
GFR, Estimated: >60 mL/min (ref >60)
Glucose, Bld: 103 mg/dL — ABNORMAL HIGH (ref 70–99)
Potassium: 3.4 mmol/L — ABNORMAL LOW (ref 3.5–5.1)
Sodium: 136 mmol/L (ref 135–145)

## 2020-07-20 LAB — PROTIME-INR
INR: 0.9 (ref 0.8–1.2)
Prothrombin Time: 12.2 seconds (ref 11.4–15.2)

## 2020-07-20 LAB — RESP PANEL BY RT-PCR (FLU A&B, COVID) ARPGX2
Influenza A by PCR: NEGATIVE
Influenza B by PCR: NEGATIVE
SARS Coronavirus 2 by RT PCR: NEGATIVE

## 2020-07-20 SURGERY — OPEN REDUCTION INTERNAL FIXATION (ORIF) PATELLA
Anesthesia: General | Site: Knee | Laterality: Left

## 2020-07-20 MED ORDER — LACTATED RINGERS IV SOLN: INTRAVENOUS | Status: DC

## 2020-07-20 MED ORDER — DEXAMETHASONE SODIUM PHOSPHATE 10 MG/ML IJ SOLN
INTRAMUSCULAR | Status: DC | PRN
  Administered 2020-07-20: 10 mg via INTRAVENOUS

## 2020-07-20 MED ORDER — FENTANYL CITRATE (PF) 100 MCG/2ML IJ SOLN
INTRAMUSCULAR | Status: DC | PRN
  Administered 2020-07-20: 250 ug via INTRAVENOUS

## 2020-07-20 MED ORDER — BISACODYL 10 MG RE SUPP: 10.0000 mg | Freq: Every day | RECTAL | Status: DC | PRN

## 2020-07-20 MED ORDER — GABAPENTIN 300 MG PO CAPS
300.0000 mg | ORAL_CAPSULE | Freq: Three times a day (TID) | ORAL | Status: DC
  Administered 2020-07-20 – 2020-07-21 (×2): 300 mg via ORAL
  Filled 2020-07-20 (×2): qty 1

## 2020-07-20 MED ORDER — CLINDAMYCIN PHOSPHATE 600 MG/50ML IV SOLN
INTRAVENOUS | Status: AC
  Filled 2020-07-20: qty 50

## 2020-07-20 MED ORDER — CHLORHEXIDINE GLUCONATE 4 % EX LIQD: 1.0000 "application " | Freq: Once | CUTANEOUS | Status: DC

## 2020-07-20 MED ORDER — EPHEDRINE 5 MG/ML INJ
INTRAVENOUS | Status: AC
  Filled 2020-07-20: qty 10

## 2020-07-20 MED ORDER — PANTOPRAZOLE SODIUM 20 MG PO TBEC
20.0000 mg | DELAYED_RELEASE_TABLET | Freq: Every day | ORAL | Status: DC
  Filled 2020-07-20: qty 1

## 2020-07-20 MED ORDER — INFLUENZA VAC SPLIT QUAD 0.5 ML IM SUSY: 0.5000 mL | PREFILLED_SYRINGE | INTRAMUSCULAR | Status: DC

## 2020-07-20 MED ORDER — FENTANYL CITRATE (PF) 100 MCG/2ML IJ SOLN: 25.0000 ug | INTRAMUSCULAR | Status: DC | PRN

## 2020-07-20 MED ORDER — ACETAMINOPHEN 325 MG PO TABS
325.0000 mg | ORAL_TABLET | Freq: Four times a day (QID) | ORAL | Status: DC | PRN
Start: 2020-07-21 — End: 2020-07-21

## 2020-07-20 MED ORDER — LIDOCAINE HCL (PF) 2 % IJ SOLN
INTRAMUSCULAR | Status: AC
  Filled 2020-07-20: qty 5

## 2020-07-20 MED ORDER — ROCURONIUM BROMIDE 100 MG/10ML IV SOLN
INTRAVENOUS | Status: DC | PRN
  Administered 2020-07-20: 50 mg via INTRAVENOUS

## 2020-07-20 MED ORDER — LIDOCAINE HCL 4 % MT SOLN
OROMUCOSAL | Status: DC | PRN
  Administered 2020-07-20: 4 mL via TOPICAL

## 2020-07-20 MED ORDER — DOXYCYCLINE HYCLATE 100 MG PO TABS
100.0000 mg | ORAL_TABLET | Freq: Two times a day (BID) | ORAL | Status: DC
  Administered 2020-07-21 (×2): 100 mg via ORAL
  Filled 2020-07-20 (×2): qty 1

## 2020-07-20 MED ORDER — SODIUM CHLORIDE 0.9 % IV SOLN: INTRAVENOUS | Status: DC

## 2020-07-20 MED ORDER — PROPOFOL 10 MG/ML IV BOLUS
INTRAVENOUS | Status: DC | PRN
  Administered 2020-07-20 (×2): 200 mg via INTRAVENOUS

## 2020-07-20 MED ORDER — CLINDAMYCIN PHOSPHATE 600 MG/50ML IV SOLN
600.0000 mg | INTRAVENOUS | Status: AC
  Administered 2020-07-20: 19:00:00 600 mg via INTRAVENOUS
  Filled 2020-07-20: qty 50

## 2020-07-20 MED ORDER — ONDANSETRON HCL 4 MG/2ML IJ SOLN: 4.0000 mg | Freq: Once | INTRAMUSCULAR | Status: DC | PRN

## 2020-07-20 MED ORDER — DOCUSATE SODIUM 100 MG PO CAPS
100.0000 mg | ORAL_CAPSULE | Freq: Two times a day (BID) | ORAL | Status: DC
  Administered 2020-07-20 – 2020-07-21 (×2): 100 mg via ORAL
  Filled 2020-07-20: qty 1

## 2020-07-20 MED ORDER — CEFAZOLIN SODIUM-DEXTROSE 2-4 GM/100ML-% IV SOLN
2.0000 g | INTRAVENOUS | Status: AC
  Administered 2020-07-20: 19:00:00 2 g via INTRAVENOUS
  Filled 2020-07-20: qty 100

## 2020-07-20 MED ORDER — CEFAZOLIN SODIUM-DEXTROSE 2-4 GM/100ML-% IV SOLN
2.0000 g | Freq: Three times a day (TID) | INTRAVENOUS | Status: DC
  Administered 2020-07-21 (×2): 2 g via INTRAVENOUS
  Filled 2020-07-20 (×3): qty 100

## 2020-07-20 MED ORDER — LORAZEPAM 1 MG PO TABS
1.0000 mg | ORAL_TABLET | Freq: Once | ORAL | Status: AC
  Administered 2020-07-20: 1 mg via ORAL
  Filled 2020-07-20: qty 1

## 2020-07-20 MED ORDER — EPHEDRINE SULFATE 50 MG/ML IJ SOLN
INTRAMUSCULAR | Status: DC | PRN
  Administered 2020-07-20 (×2): 15 mg via INTRAVENOUS

## 2020-07-20 MED ORDER — TRAMADOL HCL 50 MG PO TABS
50.0000 mg | ORAL_TABLET | Freq: Four times a day (QID) | ORAL | Status: DC
  Administered 2020-07-20 – 2020-07-21 (×2): 50 mg via ORAL
  Filled 2020-07-20 (×3): qty 1

## 2020-07-20 MED ORDER — FENTANYL CITRATE (PF) 250 MCG/5ML IJ SOLN
INTRAMUSCULAR | Status: AC
  Filled 2020-07-20: qty 5

## 2020-07-20 MED ORDER — PHENYLEPHRINE HCL (PRESSORS) 10 MG/ML IV SOLN
INTRAVENOUS | Status: DC | PRN
  Administered 2020-07-20 (×3): 100 ug via INTRAVENOUS

## 2020-07-20 MED ORDER — MIDAZOLAM HCL 2 MG/2ML IJ SOLN
INTRAMUSCULAR | Status: DC | PRN
  Administered 2020-07-20: 2 mg via INTRAVENOUS

## 2020-07-20 MED ORDER — PROPOFOL 10 MG/ML IV BOLUS
INTRAVENOUS | Status: AC
  Filled 2020-07-20: qty 20

## 2020-07-20 MED ORDER — SODIUM CHLORIDE 0.45 % IV SOLN: INTRAVENOUS | Status: DC

## 2020-07-20 MED ORDER — MORPHINE SULFATE (PF) 2 MG/ML IV SOLN
1.0000 mg | INTRAVENOUS | Status: DC | PRN
  Administered 2020-07-20 – 2020-07-21 (×5): 1 mg via INTRAVENOUS
  Filled 2020-07-20 (×5): qty 1

## 2020-07-20 MED ORDER — HYDROCODONE-ACETAMINOPHEN 5-325 MG PO TABS
ORAL_TABLET | ORAL | Status: AC
  Filled 2020-07-20: qty 2

## 2020-07-20 MED ORDER — QUETIAPINE FUMARATE 200 MG PO TABS
400.0000 mg | ORAL_TABLET | Freq: Every day | ORAL | Status: DC
  Administered 2020-07-20: 400 mg via ORAL
  Filled 2020-07-20 (×2): qty 2

## 2020-07-20 MED ORDER — DEXMEDETOMIDINE (PRECEDEX) IN NS 20 MCG/5ML (4 MCG/ML) IV SYRINGE
PREFILLED_SYRINGE | INTRAVENOUS | Status: DC | PRN
  Administered 2020-07-20: 8 ug via INTRAVENOUS
  Administered 2020-07-20 (×2): 12 ug via INTRAVENOUS

## 2020-07-20 MED ORDER — ENOXAPARIN SODIUM 30 MG/0.3ML ~~LOC~~ SOLN
30.0000 mg | SUBCUTANEOUS | Status: DC
  Administered 2020-07-21: 08:00:00 30 mg via SUBCUTANEOUS
  Filled 2020-07-20: qty 0.3

## 2020-07-20 MED ORDER — ONDANSETRON HCL 4 MG/2ML IJ SOLN
INTRAMUSCULAR | Status: DC | PRN
  Administered 2020-07-20: 4 mg via INTRAVENOUS

## 2020-07-20 MED ORDER — HYDROCODONE-ACETAMINOPHEN 7.5-325 MG PO TABS
1.0000 | ORAL_TABLET | ORAL | Status: DC | PRN
  Administered 2020-07-20: 1 via ORAL
  Administered 2020-07-21: 2 via ORAL
  Filled 2020-07-20: qty 1
  Filled 2020-07-20: qty 2

## 2020-07-20 MED ORDER — METHOCARBAMOL 1000 MG/10ML IJ SOLN
500.0000 mg | Freq: Four times a day (QID) | INTRAVENOUS | Status: DC | PRN
  Filled 2020-07-20: qty 5

## 2020-07-20 MED ORDER — ALBUTEROL SULFATE HFA 108 (90 BASE) MCG/ACT IN AERS
1.0000 | INHALATION_SPRAY | Freq: Four times a day (QID) | RESPIRATORY_TRACT | Status: DC | PRN
Start: 2020-07-20 — End: 2020-07-21
  Filled 2020-07-20: qty 6.7

## 2020-07-20 MED ORDER — FLEET ENEMA 7-19 GM/118ML RE ENEM: 1.0000 | ENEMA | Freq: Once | RECTAL | Status: DC | PRN

## 2020-07-20 MED ORDER — ESCITALOPRAM OXALATE 10 MG PO TABS
20.0000 mg | ORAL_TABLET | Freq: Every day | ORAL | Status: DC
  Administered 2020-07-20 – 2020-07-21 (×2): 20 mg via ORAL
  Filled 2020-07-20 (×2): qty 2

## 2020-07-20 MED ORDER — DEXMEDETOMIDINE (PRECEDEX) IN NS 20 MCG/5ML (4 MCG/ML) IV SYRINGE
PREFILLED_SYRINGE | INTRAVENOUS | Status: AC
  Filled 2020-07-20: qty 10

## 2020-07-20 MED ORDER — SUGAMMADEX SODIUM 200 MG/2ML IV SOLN
INTRAVENOUS | Status: DC | PRN
  Administered 2020-07-20: 200 mg via INTRAVENOUS

## 2020-07-20 MED ORDER — HYDROCODONE-ACETAMINOPHEN 5-325 MG PO TABS
1.0000 | ORAL_TABLET | ORAL | Status: DC | PRN
  Administered 2020-07-20: 2 via ORAL

## 2020-07-20 MED ORDER — MIDAZOLAM HCL 2 MG/2ML IJ SOLN
INTRAMUSCULAR | Status: AC
  Filled 2020-07-20: qty 2

## 2020-07-20 MED ORDER — ACETAMINOPHEN 500 MG PO TABS
1000.0000 mg | ORAL_TABLET | Freq: Once | ORAL | Status: AC
  Administered 2020-07-20: 1000 mg via ORAL
  Filled 2020-07-20: qty 2

## 2020-07-20 MED ORDER — MAGNESIUM HYDROXIDE 400 MG/5ML PO SUSP: 30.0000 mL | Freq: Every day | ORAL | Status: DC | PRN

## 2020-07-20 MED ORDER — HYDROMORPHONE HCL 1 MG/ML IJ SOLN
INTRAMUSCULAR | Status: DC | PRN
  Administered 2020-07-20: .5 mg via INTRAVENOUS

## 2020-07-20 MED ORDER — IBUPROFEN 600 MG PO TABS
600.0000 mg | ORAL_TABLET | Freq: Once | ORAL | Status: AC
  Administered 2020-07-20: 08:00:00 600 mg via ORAL
  Filled 2020-07-20: qty 1

## 2020-07-20 MED ORDER — HYDROMORPHONE HCL 1 MG/ML IJ SOLN
INTRAMUSCULAR | Status: AC
  Filled 2020-07-20: qty 1

## 2020-07-20 MED ORDER — ONDANSETRON HCL 4 MG/2ML IJ SOLN: 4.0000 mg | Freq: Four times a day (QID) | INTRAMUSCULAR | Status: DC | PRN

## 2020-07-20 MED ORDER — ONDANSETRON HCL 4 MG PO TABS: 4.0000 mg | ORAL_TABLET | Freq: Four times a day (QID) | ORAL | Status: DC | PRN

## 2020-07-20 MED ORDER — METOCLOPRAMIDE HCL 10 MG PO TABS: 5.0000 mg | ORAL_TABLET | Freq: Three times a day (TID) | ORAL | Status: DC | PRN

## 2020-07-20 MED ORDER — MORPHINE SULFATE (PF) 2 MG/ML IV SOLN: 0.5000 mg | INTRAVENOUS | Status: DC | PRN

## 2020-07-20 MED ORDER — CLINDAMYCIN PHOSPHATE 600 MG/50ML IV SOLN
600.0000 mg | Freq: Three times a day (TID) | INTRAVENOUS | Status: DC
  Administered 2020-07-21 (×2): 600 mg via INTRAVENOUS
  Filled 2020-07-20 (×3): qty 50

## 2020-07-20 MED ORDER — METHOCARBAMOL 500 MG PO TABS
500.0000 mg | ORAL_TABLET | Freq: Four times a day (QID) | ORAL | Status: DC | PRN
  Administered 2020-07-20: 500 mg via ORAL
  Filled 2020-07-20: qty 1

## 2020-07-20 MED ORDER — METOCLOPRAMIDE HCL 5 MG/ML IJ SOLN: 5.0000 mg | Freq: Three times a day (TID) | INTRAMUSCULAR | Status: DC | PRN

## 2020-07-20 SURGICAL SUPPLY — 47 items
BNDG COHESIVE 4X5 TAN STRL (GAUZE/BANDAGES/DRESSINGS) ×3 IMPLANT
BNDG ESMARK 6X12 TAN STRL LF (GAUZE/BANDAGES/DRESSINGS) ×5 IMPLANT
CANISTER SUCT 1200ML W/VALVE (MISCELLANEOUS) ×1 IMPLANT
CHLORAPREP W/TINT 26 (MISCELLANEOUS) ×3 IMPLANT
COOLER POLAR GLACIER W/PUMP (MISCELLANEOUS) ×1 IMPLANT
COVER WAND RF STERILE (DRAPES) ×1 IMPLANT
CUFF TOURN SGL QUICK 24 (TOURNIQUET CUFF)
CUFF TOURN SGL QUICK 30 (TOURNIQUET CUFF) ×2
CUFF TRNQT CYL 24X4X16.5-23 (TOURNIQUET CUFF) IMPLANT
CUFF TRNQT CYL 30X4X21-28X (TOURNIQUET CUFF) IMPLANT
DRAPE C-ARM XRAY 36X54 (DRAPES) ×3 IMPLANT
DRAPE FLUOR MINI C-ARM 54X84 (DRAPES) ×3 IMPLANT
DRAPE INCISE IOBAN 66X45 STRL (DRAPES) ×2 IMPLANT
ELECT REM PT RETURN 9FT ADLT (ELECTROSURGICAL) ×3
ELECTRODE REM PT RTRN 9FT ADLT (ELECTROSURGICAL) ×1 IMPLANT
GAUZE SPONGE 4X4 12PLY STRL (GAUZE/BANDAGES/DRESSINGS) ×3 IMPLANT
GAUZE XEROFORM 1X8 LF (GAUZE/BANDAGES/DRESSINGS) ×3 IMPLANT
GLOVE BIO SURGEON STRL SZ8 (GLOVE) ×3 IMPLANT
GLOVE SURG ORTHO 8.0 STRL STRW (GLOVE) ×3 IMPLANT
GLOVE SURG ORTHO 8.5 STRL (GLOVE) ×3 IMPLANT
GOWN STRL REUS W/ TWL LRG LVL3 (GOWN DISPOSABLE) ×1 IMPLANT
GOWN STRL REUS W/TWL LRG LVL3 (GOWN DISPOSABLE) ×2
GOWN STRL REUS W/TWL LRG LVL4 (GOWN DISPOSABLE) ×3 IMPLANT
HANDLE YANKAUER SUCT BULB TIP (MISCELLANEOUS) ×2 IMPLANT
IMMOB KNEE 14 THIGH 24 706614 (SOFTGOODS) ×1 IMPLANT
KIT TURNOVER KIT A (KITS) ×3 IMPLANT
MANIFOLD NEPTUNE II (INSTRUMENTS) ×3 IMPLANT
NDL SAFETY ECLIPSE 18X1.5 (NEEDLE) ×1 IMPLANT
NEEDLE HYPO 18GX1.5 SHARP (NEEDLE) ×2
NS IRRIG 1000ML POUR BTL (IV SOLUTION) ×3 IMPLANT
PACK EXTREMITY (MISCELLANEOUS) ×3 IMPLANT
PAD CAST CTTN 4X4 STRL (SOFTGOODS) ×1 IMPLANT
PAD PREP 24X41 OB/GYN DISP (PERSONAL CARE ITEMS) ×1 IMPLANT
PAD WRAPON POLAR KNEE (MISCELLANEOUS) ×1 IMPLANT
PADDING CAST COTTON 4X4 STRL (SOFTGOODS) ×2
PULSAVAC PLUS IRRIG FAN TIP (DISPOSABLE) ×3
RETRIEVER SUT HEWSON (MISCELLANEOUS) ×2 IMPLANT
SPONGE LAP 18X18 RF (DISPOSABLE) ×3 IMPLANT
STAPLER SKIN PROX 35W (STAPLE) ×3 IMPLANT
STOCKINETTE BIAS CUT 6 980064 (GAUZE/BANDAGES/DRESSINGS) ×1 IMPLANT
STOCKINETTE IMPERVIOUS 9X36 MD (GAUZE/BANDAGES/DRESSINGS) ×3 IMPLANT
SUT ETHIBOND NAB CT1 #1 30IN (SUTURE) ×3 IMPLANT
SUT TICRON 2-0 30IN 311381 (SUTURE) ×3 IMPLANT
SUT VIC AB 2-0 CT1 36 (SUTURE) ×3 IMPLANT
SYR 10ML LL (SYRINGE) ×3 IMPLANT
TIP FAN IRRIG PULSAVAC PLUS (DISPOSABLE) ×1 IMPLANT
WRAPON POLAR PAD KNEE (MISCELLANEOUS)

## 2020-07-20 NOTE — Anesthesia Postprocedure Evaluation (Signed)
Anesthesia Post Note  Patient: Kylie Lam  Procedure(s) Performed: OPEN REDUCTION INTERNAL (ORIF) FIXATION PATELLA (Left Knee)  Patient location during evaluation: PACU Anesthesia Type: General Level of consciousness: awake and alert Pain management: pain level controlled Vital Signs Assessment: post-procedure vital signs reviewed and stable Respiratory status: spontaneous breathing and respiratory function stable Cardiovascular status: stable Anesthetic complications: no   No complications documented.   Last Vitals:  Vitals:   07/20/20 2037 07/20/20 2121  BP: (!) 141/75 116/75  Pulse: (!) 106 84  Resp: 18 16  Temp:  36.7 C  SpO2: 100% 98%    Last Pain:  Vitals:   07/20/20 2100  TempSrc:   PainSc: 5                  Mone Commisso K

## 2020-07-20 NOTE — ED Notes (Signed)
Report received from [place], [place]. Pt is A&Ox4 and NAD. Pt currently sleeping at this time. ED stretcher locked and in lowest position. Call bell within reach.

## 2020-07-20 NOTE — ED Notes (Signed)
Pt's L knee wrapped with
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
## 2020-07-20 NOTE — ED Notes (Signed)
Dr. Hyacinth Meeker, MD at bedside at this time.

## 2020-07-20 NOTE — Transfer of Care (Signed)
Immediate Anesthesia Transfer of Care Note  Patient: Kylie Lam  Procedure(s) Performed: OPEN REDUCTION INTERNAL (ORIF) FIXATION PATELLA (Left Knee)  Patient Location: PACU  Anesthesia Type:General  Level of Consciousness: drowsy  Airway & Oxygen Therapy: Patient Spontanous Breathing and Patient connected to face mask oxygen  Post-op Assessment: Report given to RN and Post -op Vital signs reviewed and stable  Post vital signs: Reviewed and stable  Last Vitals:  Vitals Value Taken Time  BP 82/43 07/20/20 2004  Temp    Pulse 68 07/20/20 2010  Resp 11 07/20/20 2010  SpO2 98 % 07/20/20 2010  Vitals shown include unvalidated device data.  Last Pain:  Vitals:   07/20/20 1533  TempSrc:   PainSc: 9       Patients Stated Pain Goal: 0 (07/20/20 1433)  Complications: No complications documented.

## 2020-07-20 NOTE — Op Note (Signed)
    07/20/2020  8:05 PM  PATIENT:  Kylie Lam  39 y.o. female  PRE-OPERATIVE DIAGNOSIS:  PATELLA FRACTURE left transverse displaced  POST-OPERATIVE DIAGNOSIS:  Same  PROCEDURE:  OPEN REDUCTION INTERNAL (ORIF) FIXATION PATELLA with excision of distal fragment and reattachment of patellar tendon  SURGEON:  Anastasios Melander E Jamarrius Salay MD  ASST:    ANESTHESIA:   General  EBL: None  TOURNIQUET TIME: 54  MIN  OPERATIVE FINDINGS: Completely displaced transverse fracture left patella near the distal pole  OPERATIVE PROCEDURE:  The patient was brought to the operating room and underwent satisfactory general anesthesia in the supine position.  The operative leg was prepped and draped in sterile fashion.  Esmarch was applied and tourniquet inflated to 300 mmHg.  A midline anterior incision was made and dissection carried out sharply through subcutaneous tissue down to fascia.  A large amount of blood and hematoma was removed.  This was irrigated as well.  The fracture fragments were explored.  The distal fragment was quite small.The distal fragment did not include very much articular surface, so it was excised.   3   #2 FiberWire sutures were then woven through the patellar tendon.  These were then brought up through the patella through the drill holes.  The tendon and patella were drawn close together and the sutures then tied securely over the superior aspect of the patella, bringing the patellar tendon securely to the bone.  The medial and lateral retinaculum were then repaired with #2 FiberWire sutures.  The superficial patellar tendon and patellar periosteum was sutured with 2-0 Vicryl.  Range of motion was excellent with a no separation of the tissues.    Further irrigation was carried out.  Subcutaneous tissue was closed with 2-0 Vicryl and skin was closed with staples.  1/2% Marcaine plain was instilled into the tissues. An Aquacel dressing was applied along with a padded cotton and 6 inch bias-cut  stockinette.  A knee immobilizer was applied after the tourniquet was deflated with good return of blood flow to foot.  Patient was awakened and taken recovery in good condition.

## 2020-07-20 NOTE — Anesthesia Procedure Notes (Signed)
Procedure Name: Intubation Date/Time: 07/20/2020 6:45 PM Performed by: Katherine Basset, CRNA Pre-anesthesia Checklist: Patient identified, Emergency Drugs available, Suction available and Patient being monitored Patient Re-evaluated:Patient Re-evaluated prior to induction Oxygen Delivery Method: Circle system utilized Preoxygenation: Pre-oxygenation with 100% oxygen Induction Type: IV induction Ventilation: Mask ventilation without difficulty Laryngoscope Size: Miller and 2 Grade View: Grade I Tube type: Oral Tube size: 7.5 mm Number of attempts: 1 Airway Equipment and Method: Stylet and Oral airway Placement Confirmation: ETT inserted through vocal cords under direct vision,  positive ETCO2 and breath sounds checked- equal and bilateral Secured at: 22 cm Tube secured with: Tape Dental Injury: Teeth and Oropharynx as per pre-operative assessment
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
## 2020-07-20 NOTE — ED Notes (Signed)
Pt resting at this time, given warm blankets, waiting for EDP to evaluate and discuss imaging.
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
## 2020-07-20 NOTE — Anesthesia Preprocedure Evaluation (Signed)
Anesthesia Evaluation  Patient identified by MRN, date of birth, ID band Patient awake    Reviewed: Allergy & Precautions, NPO status , Patient's Chart, lab work & pertinent test results  History of Anesthesia Complications Negative for: history of anesthetic complications  Airway Mallampati: II       Dental  (+) Missing, Loose, Chipped, Poor Dentition,    Pulmonary asthma (last inhaler 6 months ago) , neg COPD, Current Smoker,           Cardiovascular (-) hypertension(-) Past MI and (-) CHF (-) dysrhythmias (-) Valvular Problems/Murmurs     Neuro/Psych neg Seizures (one episode at 39 yo, no meds) Anxiety Depression Traumatic brain injury    GI/Hepatic Neg liver ROS, GERD  Medicated and Controlled,  Endo/Other  neg diabetes  Renal/GU negative Renal ROS     Musculoskeletal   Abdominal   Peds  Hematology   Anesthesia Other Findings   Reproductive/Obstetrics                             Anesthesia Physical Anesthesia Plan  ASA: III and emergent  Anesthesia Plan: General   Post-op Pain Management:    Induction: Intravenous  PONV Risk Score and Plan: 2 and Dexamethasone and Ondansetron  Airway Management Planned: Oral ETT  Additional Equipment:   Intra-op Plan:   Post-operative Plan:   Informed Consent: I have reviewed the patients History and Physical, chart, labs and discussed the procedure including the risks, benefits and alternatives for the proposed anesthesia with the patient or authorized representative who has indicated his/her understanding and acceptance.       Plan Discussed with:   Anesthesia Plan Comments:         Anesthesia Quick Evaluation

## 2020-07-21 ENCOUNTER — Encounter: Payer: Self-pay | Admitting: Specialist

## 2020-07-21 LAB — URINALYSIS, ROUTINE W REFLEX MICROSCOPIC
Bilirubin Urine: NEGATIVE
Glucose, UA: NEGATIVE mg/dL
Hgb urine dipstick: NEGATIVE
Ketones, ur: NEGATIVE mg/dL
Leukocytes,Ua: NEGATIVE
Nitrite: NEGATIVE
Protein, ur: NEGATIVE mg/dL
Specific Gravity, Urine: 1.02 (ref 1.005–1.030)
pH: 5 (ref 5.0–8.0)

## 2020-07-21 MED ORDER — HYDROCODONE-ACETAMINOPHEN 7.5-325 MG PO TABS: 1.0000 | ORAL_TABLET | Freq: Four times a day (QID) | ORAL | 0 refills | Status: DC | PRN

## 2020-07-21 MED ORDER — ENOXAPARIN SODIUM 40 MG/0.4ML ~~LOC~~ SOLN: 40.0000 mg | SUBCUTANEOUS | Status: DC

## 2020-07-21 MED ORDER — ASPIRIN EC 81 MG PO TBEC: 81.0000 mg | DELAYED_RELEASE_TABLET | Freq: Three times a day (TID) | ORAL | 2 refills | Status: AC

## 2020-07-21 MED ORDER — PANTOPRAZOLE SODIUM 40 MG PO TBEC: 40.0000 mg | DELAYED_RELEASE_TABLET | Freq: Every day | ORAL | Status: DC

## 2020-07-21 MED ORDER — GABAPENTIN 100 MG PO CAPS: 100.0000 mg | ORAL_CAPSULE | Freq: Three times a day (TID) | ORAL | 2 refills | Status: AC

## 2020-07-21 MED ORDER — MELOXICAM 15 MG PO TABS: 15.0000 mg | ORAL_TABLET | Freq: Every day | ORAL | 3 refills | Status: DC

## 2020-07-21 NOTE — Progress Notes (Signed)
Patient asked his writer earlier if she can go outside in w/c. I educated patient and she still continued to ask to go outside. Charge Nurse Jun, Charity fundraiser. Also educated patient that d/t safety measures of hospital and her post operative surgery , she cannot go outside at this time. She continues to ask each staff member that goes in room if she can get in W/C and go outside. Education provided by Interior and spatial designer of Nursing, TEFL teacher as well. Will continue to redirect and educate patient on safety

## 2020-07-21 NOTE — Progress Notes (Signed)
Pt has been DC paper, wheeled out of her room with NT and her uncle. When pt was out the Nt called charge nurse that the pt has no ride and was trying to call cab. Charge nurse went out side and seen pt smoking. Volunteer was asked to help to stay with pt but no volunteer is available. Pt was encouraged to go back to her room but pt refused and said we have no responsibility with her. Pt was left in the bench with her belongings are pt request.

## 2020-07-21 NOTE — Progress Notes (Signed)
Subjective: 1 Day Post-Op Procedure(s) (LRB): OPEN REDUCTION INTERNAL (ORIF) FIXATION PATELLA (Left)   Patient alert and says she did well with PT this morning. Moderate pain. Neurovascular status good. Wants to go home this afternoon. Patient reports pain as moderate.  Objective:   VITALS:   Vitals:   07/21/20 0808 07/21/20 1122  BP: 104/66 112/62  Pulse: 75 77  Resp: 16 16  Temp: 98.2 F (36.8 C) 98.2 F (36.8 C)  SpO2: 98% 97%    Neurologically intact Sensation intact distally Dorsiflexion/Plantar flexion intact Incision: dressing C/D/I  LABS Recent Labs    07/20/20 0847  HGB 14.1  HCT 42.7  WBC 11.1*  PLT 267    Recent Labs    07/20/20 0847  NA 136  K 3.4*  BUN 20  CREATININE 1.07*  GLUCOSE 103*    Recent Labs    07/20/20 0847  INR 0.9     Assessment/Plan: 1 Day Post-Op Procedure(s) (LRB): OPEN REDUCTION INTERNAL (ORIF) FIXATION PATELLA (Left)   Up with therapy D/C IV fluids Discharge home with home health  Enteric-coated aspirin twice a day for DVT prophylaxis Return to clinic in 1 week. Touchdown weightbearing only.

## 2020-07-21 NOTE — Discharge Summary (Signed)
Physician Discharge Summary  Patient ID: Kylie Lam MRN: [id number] DOB/AGE: [birth year] 39 y.o.  Admit date: 07/20/2020 Discharge date: 07/21/2020  Admission Diagnoses: Left displaced patella fracture  Discharge Diagnoses: Same Active Problems:   Displaced comminuted fracture of left patella, initial encounter for closed fracture   Discharged Condition: good  Hospital Course: Patient underwent surgical repair of the left patella fracture 07/20/2020. She had moderate pain overnight but wished to go home the next day. She is discharged with home health PT. She will be seen in the office in 1 week.  Consults: None  Significant Diagnostic Studies: radiology: X-Ray: Displaced fracture left patella  Treatments: antibiotics: Ancef  Discharge Exam: Blood pressure 112/62, pulse 77, temperature 98.2 F (36.8 C), resp. rate 16, height 5\' 9"  (1.753 m), weight 81.6 kg, SpO2 97 %. Patient is alert and cooperative. Dressings are dry. Neurovascular status good distally.  Disposition: Discharge disposition: 01-Home or Self Care       Discharge Instructions    Call MD for:  persistant nausea and vomiting   Complete by: As directed    Call MD for:  redness, tenderness, or signs of infection (pain, swelling, redness, odor or green/yellow discharge around incision site)   Complete by: As directed    Call MD for:  severe uncontrolled pain   Complete by: As directed    Call MD for:  temperature >100.4   Complete by: As directed    Diet - low sodium heart healthy   Complete by: As directed    Discharge instructions   Complete by: As directed    Remain in knee immobilizer at all times Partial weight on left leg only Keep leg elevated Take buffered aspirin twice a day Return to clinic 1 week   Increase activity slowly   Complete by: As directed    No wound care   Complete by: As directed      Allergies as of 07/21/2020      Reactions   Baclofen Other (See Comments)   Dizzy,  falling, can't control actions   Haldol [haloperidol Lactate] Other (See Comments)   Stiffness   Prozac [fluoxetine Hcl]    Redness all over body.      Medication List    TAKE these medications   albuterol 108 (90 Base) MCG/ACT inhaler Commonly known as: VENTOLIN HFA Inhale 1-2 puffs into the lungs every 6 (six) hours as needed for wheezing or shortness of breath.   aspirin EC 81 MG tablet Take 1 tablet (81 mg total) by mouth every 8 (eight) hours.   doxycycline 100 MG capsule Commonly known as: VIBRAMYCIN Take 1 capsule (100 mg total) by mouth 2 (two) times daily.   escitalopram 20 MG tablet Commonly known as: LEXAPRO Take 20 mg by mouth daily.   gabapentin 100 MG capsule Commonly known as: Neurontin Take 1 capsule (100 mg total) by mouth 3 (three) times daily.   HYDROcodone-acetaminophen 7.5-325 MG tablet Commonly known as: Norco Take 1-2 tablets by mouth every 6 (six) hours as needed for moderate pain.   meloxicam 15 MG tablet Commonly known as: MOBIC Take 1 tablet (15 mg total) by mouth daily.   pantoprazole 20 MG tablet Commonly known as: PROTONIX Take 20 mg by mouth daily.   QUEtiapine 400 MG tablet Commonly known as: SEROQUEL Take 400 mg by mouth at bedtime.            Durable Medical Equipment  (From admission, onward)  Start     Ordered   07/20/20 2225  DME Walker rolling  Once       Question Answer Comment  Walker: With 5 Inch Wheels   Patient needs a walker to treat with the following condition Displaced comminuted fracture of left patella, initial encounter for closed fracture      07/20/20 2225           Signed: [name] 07/21/2020, 1:12 PM

## 2020-07-21 NOTE — Progress Notes (Signed)
PHARMACIST - PHYSICIAN COMMUNICATION  CONCERNING:  Enoxaparin (Lovenox) for DVT Prophylaxis    RECOMMENDATION: Patient was prescribed enoxaprin 30mg  q24 hours for VTE prophylaxis.   Filed Weights   07/20/20 0506  Weight: 81.6 kg (180 lb)    Body mass index is 26.58 kg/m.  Estimated Creatinine Clearance: 79.1 mL/min (A) (by C-G formula based on SCr of 1.07 mg/dL (H)).   Based on [facility] policy patient is candidate for enoxaparin 40mg  every 24 hours based on CrCl >37ml/min or Weight >45kg  DESCRIPTION: Pharmacy has adjusted enoxaparin dose per [facility] policy.  Patient is now receiving enoxaparin 40 mg every 24 hours    31m, PharmD, BCPS Clinical Pharmacist 07/21/2020 9:44 AM
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
## 2020-07-31 ENCOUNTER — Other Ambulatory Visit: Payer: Self-pay | Admitting: Specialist
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
## 2020-08-01 ENCOUNTER — Other Ambulatory Visit
Admission: RE | Admit: 2020-08-01 | Discharge: 2020-08-01 | Disposition: A | Discharge status: Home / Self Care | Payer: Non-veteran care | Source: Ambulatory Visit | Attending: Specialist | Admitting: Specialist

## 2020-08-01 NOTE — Pre-Procedure Instructions (Signed)
Attempted to contact patient via phone to remind her of her required covid test to be completed today. Unable to directly reach patient but did leave a detailed message on her machine to come today for preoperative swabbing.

## 2020-08-04 ENCOUNTER — Observation Stay
Admission: RE | Admit: 2020-08-04 | Discharge: 2020-08-05 | Disposition: A | Discharge status: Home / Self Care | Payer: No Typology Code available for payment source | Attending: Specialist | Admitting: Specialist

## 2020-08-04 ENCOUNTER — Encounter
Admission: RE | Disposition: A | Discharge status: Home / Self Care | Payer: Self-pay | Source: Home / Self Care | Attending: Specialist

## 2020-08-04 ENCOUNTER — Other Ambulatory Visit: Payer: Self-pay

## 2020-08-04 ENCOUNTER — Encounter: Payer: Self-pay | Admitting: Specialist

## 2020-08-04 ENCOUNTER — Ambulatory Visit: Payer: No Typology Code available for payment source | Admitting: Certified Registered Nurse Anesthetist

## 2020-08-04 DIAGNOSIS — Z7982 Long term (current) use of aspirin: Secondary | ICD-10-CM | POA: Insufficient documentation

## 2020-08-04 DIAGNOSIS — J45909 Unspecified asthma, uncomplicated: Secondary | ICD-10-CM | POA: Diagnosis not present

## 2020-08-04 DIAGNOSIS — F17210 Nicotine dependence, cigarettes, uncomplicated: Secondary | ICD-10-CM | POA: Diagnosis not present

## 2020-08-04 DIAGNOSIS — M6688 Spontaneous rupture of other tendons, other: Secondary | ICD-10-CM | POA: Diagnosis not present

## 2020-08-04 DIAGNOSIS — Z7951 Long term (current) use of inhaled steroids: Secondary | ICD-10-CM | POA: Diagnosis not present

## 2020-08-04 DIAGNOSIS — Z20822 Contact with and (suspected) exposure to covid-19: Secondary | ICD-10-CM | POA: Diagnosis not present

## 2020-08-04 DIAGNOSIS — S86812A Strain of other muscle(s) and tendon(s) at lower leg level, left leg, initial encounter: Secondary | ICD-10-CM | POA: Diagnosis present

## 2020-08-04 HISTORY — PX: PATELLAR TENDON REPAIR: SHX737

## 2020-08-04 LAB — URINE DRUG SCREEN, QUALITATIVE (ARMC ONLY)
Amphetamines, Ur Screen: POSITIVE — AB
Barbiturates, Ur Screen: NOT DETECTED
Benzodiazepine, Ur Scrn: NOT DETECTED
Cannabinoid 50 Ng, Ur ~~LOC~~: POSITIVE — AB
Cocaine Metabolite,Ur ~~LOC~~: NOT DETECTED
MDMA (Ecstasy)Ur Screen: NOT DETECTED
Methadone Scn, Ur: NOT DETECTED
Opiate, Ur Screen: POSITIVE — AB
Phencyclidine (PCP) Ur S: NOT DETECTED
Tricyclic, Ur Screen: NOT DETECTED

## 2020-08-04 LAB — CBC
HCT: 34.4 % — ABNORMAL LOW (ref 36.0–46.0)
Hemoglobin: 11.3 g/dL — ABNORMAL LOW (ref 12.0–15.0)
MCH: 30.7 pg (ref 26.0–34.0)
MCHC: 32.8 g/dL (ref 30.0–36.0)
MCV: 93.5 fL (ref 80.0–100.0)
Platelets: 290 10*3/uL (ref 150–400)
RBC: 3.68 MIL/uL — ABNORMAL LOW (ref 3.87–5.11)
RDW: 13.3 % (ref 11.5–15.5)
WBC: 10.4 10*3/uL (ref 4.0–10.5)
nRBC: 0 % (ref 0.0–0.2)

## 2020-08-04 LAB — CREATININE, SERUM
Creatinine, Ser: 1.03 mg/dL — ABNORMAL HIGH (ref 0.44–1.00)
GFR, Estimated: >60 mL/min (ref >60)

## 2020-08-04 LAB — SARS CORONAVIRUS 2 BY RT PCR (HOSPITAL ORDER, PERFORMED IN ~~LOC~~ HOSPITAL LAB): SARS Coronavirus 2: NEGATIVE

## 2020-08-04 SURGERY — REPAIR, TENDON, PATELLAR
Anesthesia: General | Site: Knee | Laterality: Left

## 2020-08-04 MED ORDER — DOXYCYCLINE HYCLATE 100 MG PO TABS
100.0000 mg | ORAL_TABLET | Freq: Two times a day (BID) | ORAL | Status: DC
  Administered 2020-08-04 – 2020-08-05 (×2): 100 mg via ORAL
  Filled 2020-08-04 (×2): qty 1

## 2020-08-04 MED ORDER — MELOXICAM 7.5 MG PO TABS
ORAL_TABLET | ORAL | Status: AC
  Administered 2020-08-04: 08:00:00 15 mg via ORAL
  Filled 2020-08-04: qty 2

## 2020-08-04 MED ORDER — DEXMEDETOMIDINE (PRECEDEX) IN NS 20 MCG/5ML (4 MCG/ML) IV SYRINGE
PREFILLED_SYRINGE | INTRAVENOUS | Status: DC | PRN
  Administered 2020-08-04: 8 ug via INTRAVENOUS
  Administered 2020-08-04: 12 ug via INTRAVENOUS
  Administered 2020-08-04: 8 ug via INTRAVENOUS
  Administered 2020-08-04: 12 ug via INTRAVENOUS

## 2020-08-04 MED ORDER — MIDAZOLAM HCL 2 MG/2ML IJ SOLN
INTRAMUSCULAR | Status: DC | PRN
  Administered 2020-08-04: 2 mg via INTRAVENOUS

## 2020-08-04 MED ORDER — ACETAMINOPHEN 10 MG/ML IV SOLN
INTRAVENOUS | Status: AC
  Filled 2020-08-04: qty 100

## 2020-08-04 MED ORDER — LIDOCAINE HCL (CARDIAC) PF 100 MG/5ML IV SOSY
PREFILLED_SYRINGE | INTRAVENOUS | Status: DC | PRN
  Administered 2020-08-04: 100 mg via INTRAVENOUS

## 2020-08-04 MED ORDER — MAGNESIUM HYDROXIDE 400 MG/5ML PO SUSP: 30.0000 mL | Freq: Every day | ORAL | Status: DC | PRN

## 2020-08-04 MED ORDER — ONDANSETRON HCL 4 MG/2ML IJ SOLN: 4.0000 mg | Freq: Once | INTRAMUSCULAR | Status: DC | PRN

## 2020-08-04 MED ORDER — ACETAMINOPHEN 325 MG PO TABS: 325.0000 mg | ORAL_TABLET | Freq: Four times a day (QID) | ORAL | Status: DC | PRN

## 2020-08-04 MED ORDER — PANTOPRAZOLE SODIUM 20 MG PO TBEC
20.0000 mg | DELAYED_RELEASE_TABLET | Freq: Every day | ORAL | Status: DC
Start: 2020-08-04 — End: 2020-08-05
  Administered 2020-08-04 – 2020-08-05 (×2): 20 mg via ORAL
  Filled 2020-08-04 (×2): qty 1

## 2020-08-04 MED ORDER — ORAL CARE MOUTH RINSE: 15.0000 mL | Freq: Once | OROMUCOSAL | Status: AC

## 2020-08-04 MED ORDER — TRAZODONE HCL 100 MG PO TABS
300.0000 mg | ORAL_TABLET | Freq: Every day | ORAL | Status: DC
  Administered 2020-08-04: 21:00:00 300 mg via ORAL
  Filled 2020-08-04: qty 3

## 2020-08-04 MED ORDER — CLINDAMYCIN PHOSPHATE 600 MG/50ML IV SOLN
600.0000 mg | Freq: Three times a day (TID) | INTRAVENOUS | Status: AC
  Administered 2020-08-05 (×2): 600 mg via INTRAVENOUS
  Filled 2020-08-04 (×3): qty 50

## 2020-08-04 MED ORDER — METHOCARBAMOL 1000 MG/10ML IJ SOLN
500.0000 mg | Freq: Four times a day (QID) | INTRAVENOUS | Status: DC | PRN
  Filled 2020-08-04: qty 5

## 2020-08-04 MED ORDER — ACETAMINOPHEN 10 MG/ML IV SOLN
INTRAVENOUS | Status: DC | PRN
  Administered 2020-08-04: 1000 mg via INTRAVENOUS

## 2020-08-04 MED ORDER — HYDROCODONE-ACETAMINOPHEN 5-325 MG PO TABS: 1.0000 | ORAL_TABLET | ORAL | Status: DC | PRN

## 2020-08-04 MED ORDER — MORPHINE SULFATE (PF) 2 MG/ML IV SOLN
0.5000 mg | INTRAVENOUS | Status: DC | PRN
  Administered 2020-08-04 (×2): 1 mg via INTRAVENOUS
  Administered 2020-08-05: 0.5 mg via INTRAVENOUS
  Administered 2020-08-05: 1 mg via INTRAVENOUS
  Filled 2020-08-04 (×4): qty 1

## 2020-08-04 MED ORDER — PSEUDOEPHEDRINE HCL 30 MG PO TABS
30.0000 mg | ORAL_TABLET | ORAL | Status: DC | PRN
  Filled 2020-08-04: qty 1

## 2020-08-04 MED ORDER — PROPOFOL 10 MG/ML IV BOLUS
INTRAVENOUS | Status: AC
  Filled 2020-08-04: qty 80

## 2020-08-04 MED ORDER — QUETIAPINE FUMARATE 200 MG PO TABS
400.0000 mg | ORAL_TABLET | Freq: Every day | ORAL | Status: DC
  Administered 2020-08-04: 22:00:00 400 mg via ORAL
  Filled 2020-08-04 (×2): qty 2

## 2020-08-04 MED ORDER — CLINDAMYCIN PHOSPHATE 600 MG/50ML IV SOLN
INTRAVENOUS | Status: AC
  Administered 2020-08-04: 17:00:00 600 mg via INTRAVENOUS
  Filled 2020-08-04: qty 50

## 2020-08-04 MED ORDER — LACTATED RINGERS IV SOLN: INTRAVENOUS | Status: DC

## 2020-08-04 MED ORDER — ATROPINE SULFATE 1 MG/10ML IJ SOSY
PREFILLED_SYRINGE | INTRAMUSCULAR | Status: AC
  Filled 2020-08-04: qty 10

## 2020-08-04 MED ORDER — METHOCARBAMOL 500 MG PO TABS
ORAL_TABLET | ORAL | Status: AC
  Filled 2020-08-04: qty 1

## 2020-08-04 MED ORDER — BUPIVACAINE HCL (PF) 0.5 % IJ SOLN
INTRAMUSCULAR | Status: DC | PRN
  Administered 2020-08-04: 30 mL

## 2020-08-04 MED ORDER — HYDROCODONE-ACETAMINOPHEN 7.5-325 MG PO TABS
1.0000 | ORAL_TABLET | Freq: Four times a day (QID) | ORAL | Status: DC | PRN
  Administered 2020-08-04 – 2020-08-05 (×2): 2 via ORAL
  Filled 2020-08-04 (×2): qty 2

## 2020-08-04 MED ORDER — PROPOFOL 10 MG/ML IV BOLUS
INTRAVENOUS | Status: DC | PRN
  Administered 2020-08-04: 200 mg via INTRAVENOUS

## 2020-08-04 MED ORDER — METOCLOPRAMIDE HCL 10 MG PO TABS
5.0000 mg | ORAL_TABLET | Freq: Three times a day (TID) | ORAL | Status: DC | PRN
Start: 2020-08-04 — End: 2020-08-05

## 2020-08-04 MED ORDER — ESCITALOPRAM OXALATE 10 MG PO TABS
20.0000 mg | ORAL_TABLET | Freq: Every day | ORAL | Status: DC
  Administered 2020-08-05: 09:00:00 20 mg via ORAL
  Filled 2020-08-04 (×2): qty 2

## 2020-08-04 MED ORDER — CEFAZOLIN SODIUM-DEXTROSE 2-4 GM/100ML-% IV SOLN
2.0000 g | INTRAVENOUS | Status: AC
  Administered 2020-08-04: 10:00:00 2 g via INTRAVENOUS

## 2020-08-04 MED ORDER — FENTANYL CITRATE (PF) 100 MCG/2ML IJ SOLN
INTRAMUSCULAR | Status: DC | PRN
  Administered 2020-08-04 (×4): 50 ug via INTRAVENOUS

## 2020-08-04 MED ORDER — MEPERIDINE HCL 25 MG/ML IJ SOLN
12.5000 mg | Freq: Once | INTRAMUSCULAR | Status: DC
  Filled 2020-08-04: qty 1

## 2020-08-04 MED ORDER — METOCLOPRAMIDE HCL 5 MG/ML IJ SOLN: 5.0000 mg | Freq: Three times a day (TID) | INTRAMUSCULAR | Status: DC | PRN

## 2020-08-04 MED ORDER — BISACODYL 10 MG RE SUPP: 10.0000 mg | Freq: Every day | RECTAL | Status: DC | PRN

## 2020-08-04 MED ORDER — GABAPENTIN 300 MG PO CAPS
ORAL_CAPSULE | ORAL | Status: AC
  Administered 2020-08-04: 08:00:00 300 mg via ORAL
  Filled 2020-08-04: qty 1

## 2020-08-04 MED ORDER — ONDANSETRON HCL 4 MG/2ML IJ SOLN
INTRAMUSCULAR | Status: DC | PRN
  Administered 2020-08-04: 4 mg via INTRAVENOUS

## 2020-08-04 MED ORDER — GABAPENTIN 300 MG PO CAPS
300.0000 mg | ORAL_CAPSULE | Freq: Three times a day (TID) | ORAL | Status: DC
  Administered 2020-08-04 – 2020-08-05 (×3): 300 mg via ORAL
  Filled 2020-08-04 (×3): qty 1

## 2020-08-04 MED ORDER — ASPIRIN EC 81 MG PO TBEC
81.0000 mg | DELAYED_RELEASE_TABLET | Freq: Three times a day (TID) | ORAL | Status: DC
  Administered 2020-08-04 – 2020-08-05 (×2): 81 mg via ORAL
  Filled 2020-08-04 (×2): qty 1

## 2020-08-04 MED ORDER — GABAPENTIN 300 MG PO CAPS: 300.0000 mg | ORAL_CAPSULE | ORAL | Status: AC

## 2020-08-04 MED ORDER — FENTANYL CITRATE (PF) 100 MCG/2ML IJ SOLN
25.0000 ug | INTRAMUSCULAR | Status: DC | PRN
  Administered 2020-08-04 (×4): 25 ug via INTRAVENOUS

## 2020-08-04 MED ORDER — FENTANYL CITRATE (PF) 100 MCG/2ML IJ SOLN
INTRAMUSCULAR | Status: AC
  Filled 2020-08-04: qty 2

## 2020-08-04 MED ORDER — METHOCARBAMOL 500 MG PO TABS
500.0000 mg | ORAL_TABLET | Freq: Four times a day (QID) | ORAL | Status: DC | PRN
  Administered 2020-08-04: 13:00:00 500 mg via ORAL

## 2020-08-04 MED ORDER — ONDANSETRON HCL 4 MG/2ML IJ SOLN: 4.0000 mg | Freq: Four times a day (QID) | INTRAMUSCULAR | Status: DC | PRN

## 2020-08-04 MED ORDER — MELOXICAM 7.5 MG PO TABS: 15.0000 mg | ORAL_TABLET | ORAL | Status: AC

## 2020-08-04 MED ORDER — PROPOFOL 10 MG/ML IV BOLUS
INTRAVENOUS | Status: AC
  Filled 2020-08-04: qty 20

## 2020-08-04 MED ORDER — CHLORHEXIDINE GLUCONATE 0.12 % MT SOLN
OROMUCOSAL | Status: AC
  Administered 2020-08-04: 08:00:00 15 mL via OROMUCOSAL
  Filled 2020-08-04: qty 15

## 2020-08-04 MED ORDER — ONDANSETRON HCL 4 MG PO TABS: 4.0000 mg | ORAL_TABLET | Freq: Four times a day (QID) | ORAL | Status: DC | PRN

## 2020-08-04 MED ORDER — DOCUSATE SODIUM 100 MG PO CAPS
100.0000 mg | ORAL_CAPSULE | Freq: Two times a day (BID) | ORAL | Status: DC
  Filled 2020-08-04: qty 1

## 2020-08-04 MED ORDER — PHENYLEPHRINE HCL (PRESSORS) 10 MG/ML IV SOLN
INTRAVENOUS | Status: DC | PRN
  Administered 2020-08-04: 100 ug via INTRAVENOUS

## 2020-08-04 MED ORDER — CHLORHEXIDINE GLUCONATE CLOTH 2 % EX PADS: 6.0000 | MEDICATED_PAD | Freq: Once | CUTANEOUS | Status: DC

## 2020-08-04 MED ORDER — DEXAMETHASONE SODIUM PHOSPHATE 10 MG/ML IJ SOLN
INTRAMUSCULAR | Status: DC | PRN
  Administered 2020-08-04: 5 mg via INTRAVENOUS

## 2020-08-04 MED ORDER — ALBUTEROL SULFATE HFA 108 (90 BASE) MCG/ACT IN AERS
1.0000 | INHALATION_SPRAY | Freq: Four times a day (QID) | RESPIRATORY_TRACT | Status: DC | PRN
  Filled 2020-08-04: qty 6.7

## 2020-08-04 MED ORDER — ENOXAPARIN SODIUM 30 MG/0.3ML ~~LOC~~ SOLN
30.0000 mg | SUBCUTANEOUS | Status: DC
  Administered 2020-08-05: 09:00:00 30 mg via SUBCUTANEOUS
  Filled 2020-08-04: qty 0.3

## 2020-08-04 MED ORDER — CEFAZOLIN SODIUM-DEXTROSE 2-4 GM/100ML-% IV SOLN
INTRAVENOUS | Status: AC
  Administered 2020-08-04: 17:00:00 2 g via INTRAVENOUS
  Filled 2020-08-04: qty 100

## 2020-08-04 MED ORDER — FLEET ENEMA 7-19 GM/118ML RE ENEM: 1.0000 | ENEMA | Freq: Once | RECTAL | Status: DC | PRN

## 2020-08-04 MED ORDER — MEPERIDINE HCL 50 MG/ML IJ SOLN
INTRAMUSCULAR | Status: AC
  Administered 2020-08-04: 08:00:00 12.5 mg
  Filled 2020-08-04: qty 1

## 2020-08-04 MED ORDER — FENTANYL CITRATE (PF) 100 MCG/2ML IJ SOLN
INTRAMUSCULAR | Status: AC
  Administered 2020-08-04: 13:00:00 25 ug via INTRAVENOUS
  Filled 2020-08-04: qty 2

## 2020-08-04 MED ORDER — MIDAZOLAM HCL 2 MG/2ML IJ SOLN
INTRAMUSCULAR | Status: AC
  Filled 2020-08-04: qty 2

## 2020-08-04 MED ORDER — NEOMYCIN-POLYMYXIN B GU 40-200000 IR SOLN
Status: DC | PRN
  Administered 2020-08-04: 4 mL

## 2020-08-04 MED ORDER — FENTANYL CITRATE (PF) 100 MCG/2ML IJ SOLN
INTRAMUSCULAR | Status: AC
  Administered 2020-08-04: 12:00:00 25 ug via INTRAVENOUS
  Filled 2020-08-04: qty 2

## 2020-08-04 MED ORDER — SODIUM CHLORIDE 0.45 % IV SOLN: INTRAVENOUS | Status: DC

## 2020-08-04 MED ORDER — MELOXICAM 7.5 MG PO TABS
15.0000 mg | ORAL_TABLET | Freq: Every day | ORAL | Status: DC
  Administered 2020-08-05: 09:00:00 15 mg via ORAL
  Filled 2020-08-04: qty 2

## 2020-08-04 MED ORDER — CLINDAMYCIN PHOSPHATE 600 MG/50ML IV SOLN
600.0000 mg | INTRAVENOUS | Status: AC
  Administered 2020-08-04: 10:00:00 600 mg via INTRAVENOUS

## 2020-08-04 MED ORDER — CEFAZOLIN SODIUM-DEXTROSE 2-4 GM/100ML-% IV SOLN
2.0000 g | Freq: Three times a day (TID) | INTRAVENOUS | Status: AC
  Administered 2020-08-05 (×2): 2 g via INTRAVENOUS
  Filled 2020-08-04 (×5): qty 100

## 2020-08-04 MED ORDER — CHLORHEXIDINE GLUCONATE 0.12 % MT SOLN: 15.0000 mL | Freq: Once | OROMUCOSAL | Status: AC

## 2020-08-04 SURGICAL SUPPLY — 50 items
18g suture wire ×2 IMPLANT
BNDG COHESIVE 4X5 TAN STRL (GAUZE/BANDAGES/DRESSINGS) ×3 IMPLANT
BNDG ESMARK 6X12 TAN STRL LF (GAUZE/BANDAGES/DRESSINGS) ×3 IMPLANT
CHLORAPREP W/TINT 26 (MISCELLANEOUS) ×5 IMPLANT
COVER WAND RF STERILE (DRAPES) ×3 IMPLANT
CUFF TOURN SGL QUICK 24 (TOURNIQUET CUFF)
CUFF TOURN SGL QUICK 30 (TOURNIQUET CUFF) ×2
CUFF TRNQT CYL 24X4X16.5-23 (TOURNIQUET CUFF) IMPLANT
CUFF TRNQT CYL 30X4X21-28X (TOURNIQUET CUFF) IMPLANT
DRAPE INCISE IOBAN 66X45 STRL (DRAPES) ×3 IMPLANT
ELECT REM PT RETURN 9FT ADLT (ELECTROSURGICAL) ×3
ELECTRODE REM PT RTRN 9FT ADLT (ELECTROSURGICAL) ×1 IMPLANT
GAUZE SPONGE 4X4 12PLY STRL (GAUZE/BANDAGES/DRESSINGS) ×3 IMPLANT
GAUZE XEROFORM 1X8 LF (GAUZE/BANDAGES/DRESSINGS) ×3 IMPLANT
GLOVE BIO SURGEON STRL SZ8 (GLOVE) ×7 IMPLANT
GLOVE SURG ORTHO 8.0 STRL STRW (GLOVE) ×5 IMPLANT
GLOVE SURG ORTHO 8.5 STRL (GLOVE) ×3 IMPLANT
GOWN STRL REUS W/ TWL LRG LVL3 (GOWN DISPOSABLE) ×1 IMPLANT
GOWN STRL REUS W/TWL LRG LVL3 (GOWN DISPOSABLE) ×4
GOWN STRL REUS W/TWL LRG LVL4 (GOWN DISPOSABLE) ×1 IMPLANT
HANDLE YANKAUER SUCT BULB TIP (MISCELLANEOUS) ×3 IMPLANT
IMMBOLIZER KNEE 19 BLUE UNIV (SOFTGOODS) ×1 IMPLANT
KIT TURNOVER KIT A (KITS) ×3 IMPLANT
MANIFOLD NEPTUNE II (INSTRUMENTS) ×3 IMPLANT
NDL MAYO CATGUT SZ4 TPR NDL (NEEDLE) ×1 IMPLANT
NDL SPNL 20GX3.5 QUINCKE YW (NEEDLE) IMPLANT
NEEDLE MAYO CATGUT SZ4 (NEEDLE) ×3 IMPLANT
NEEDLE SPNL 20GX3.5 QUINCKE YW (NEEDLE) ×3 IMPLANT
NS IRRIG 1000ML POUR BTL (IV SOLUTION) ×3 IMPLANT
PACK EXTREMITY ARMC (MISCELLANEOUS) ×3 IMPLANT
PAD ABD DERMACEA PRESS 5X9 (GAUZE/BANDAGES/DRESSINGS) ×2 IMPLANT
PAD ARMBOARD 7.5X6 YLW CONV (MISCELLANEOUS) ×3 IMPLANT
PAD CAST CTTN 4X4 STRL (SOFTGOODS) ×1 IMPLANT
PAD PREP 24X41 OB/GYN DISP (PERSONAL CARE ITEMS) ×3 IMPLANT
PADDING CAST COTTON 4X4 STRL (SOFTGOODS) ×2
PULSAVAC PLUS IRRIG FAN TIP (DISPOSABLE) ×3
RETRIEVER SUT HEWSON (MISCELLANEOUS) ×2 IMPLANT
SPONGE LAP 18X18 RF (DISPOSABLE) ×5 IMPLANT
STAPLER SKIN PROX 35W (STAPLE) ×3 IMPLANT
STOCKINETTE BIAS CUT 6 980064 (GAUZE/BANDAGES/DRESSINGS) ×2 IMPLANT
STOCKINETTE M/LG 89821 (MISCELLANEOUS) ×3 IMPLANT
STOCKINETTE STRL 6IN 960660 (GAUZE/BANDAGES/DRESSINGS) ×3 IMPLANT
SUT ETHIBOND #5 BRAIDED 30INL (SUTURE) ×2 IMPLANT
SUT ORTHOCORD W/MULTIPK NDL (SUTURE) ×2 IMPLANT
SUT QUILL PDO 0 36 36 VIOLET (SUTURE) ×2 IMPLANT
SUT STEEL 7 (SUTURE) ×2 IMPLANT
SUT TICRON 2-0 30IN 311381 (SUTURE) ×3 IMPLANT
SUT VIC AB 2-0 CT1 (SUTURE) ×2 IMPLANT
SUT VICRYL+ 3-0 36IN CT-1 (SUTURE) ×3 IMPLANT
TIP FAN IRRIG PULSAVAC PLUS (DISPOSABLE) IMPLANT

## 2020-08-04 NOTE — Progress Notes (Signed)
Patient went outside to smoke, was advised she isn't supposed to do this. Patient IV still in place. Patient brought back up by [facility].

## 2020-08-04 NOTE — Anesthesia Preprocedure Evaluation (Signed)
Anesthesia Evaluation  Patient identified by MRN, date of birth, ID band Patient awake    Reviewed: Allergy & Precautions, H&P , NPO status , Patient's Chart, lab work & pertinent test results, reviewed documented beta blocker date and time   Airway Mallampati: II  TM Distance: >3 FB Neck ROM: full    Dental  (+) Teeth Intact   Pulmonary asthma , Current Smoker,    Pulmonary exam normal        Cardiovascular Exercise Tolerance: Good negative cardio ROS Normal cardiovascular exam Rate:Normal     Neuro/Psych PSYCHIATRIC DISORDERS Anxiety Depression TIA   GI/Hepatic Neg liver ROS, GERD  Medicated,  Endo/Other  negative endocrine ROS  Renal/GU Renal disease  negative genitourinary   Musculoskeletal   Abdominal   Peds  Hematology negative hematology ROS (+)   Anesthesia Other Findings   Reproductive/Obstetrics negative OB ROS                             Anesthesia Physical Anesthesia Plan  ASA: III  Anesthesia Plan: General LMA   Post-op Pain Management:    Induction:   PONV Risk Score and Plan:   Airway Management Planned:   Additional Equipment:   Intra-op Plan:   Post-operative Plan:   Informed Consent: I have reviewed the patients History and Physical, chart, labs and discussed the procedure including the risks, benefits and alternatives for the proposed anesthesia with the patient or authorized representative who has indicated his/her understanding and acceptance.       Plan Discussed with: CRNA  Anesthesia Plan Comments:         Anesthesia Quick Evaluation

## 2020-08-04 NOTE — Anesthesia Procedure Notes (Signed)
Procedure Name: LMA Insertion Date/Time: 08/04/2020 9:29 AM Performed by: Henrietta Hoover, CRNA Pre-anesthesia Checklist: Patient identified, Emergency Drugs available, Suction available and Patient being monitored Patient Re-evaluated:Patient Re-evaluated prior to induction Oxygen Delivery Method: Circle system utilized Preoxygenation: Pre-oxygenation with 100% oxygen Induction Type: IV induction Ventilation: Mask ventilation without difficulty LMA: LMA inserted LMA Size: 4.0 Number of attempts: 1 Placement Confirmation: positive ETCO2 and breath sounds checked- equal and bilateral Tube secured with: Tape Dental Injury: Teeth and Oropharynx as per pre-operative assessment

## 2020-08-04 NOTE — Progress Notes (Signed)
Patient unable to tolerate noises coming from room 141. So patient was moved from room 142 to 148.

## 2020-08-04 NOTE — Transfer of Care (Signed)
Immediate Anesthesia Transfer of Care Note  Patient: Kylie Lam  Procedure(s) Performed: PATELLA TENDON REPAIR (Left Knee)  Patient Location: PACU  Anesthesia Type:General  Level of Consciousness: drowsy and patient cooperative  Airway & Oxygen Therapy: Patient Spontanous Breathing and Patient connected to face mask oxygen  Post-op Assessment: Report given to RN and Post -op Vital signs reviewed and stable  Post vital signs: Reviewed and stable. Dr Pernell Dupre aware of pt's low BP.  Last Vitals:  Vitals Value Taken Time  BP 77/49 08/04/20 1126  Temp 37.4 C 08/04/20 1118  Pulse 75 08/04/20 1127  Resp 12 08/04/20 1127  SpO2 100 % 08/04/20 1127  Vitals shown include unvalidated device data.  Last Pain:  Vitals:   08/04/20 1118  TempSrc:   PainSc: Asleep         Complications: No complications documented.
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
## 2020-08-04 NOTE — Op Note (Signed)
08/04/2020  11:15 AM  PATIENT:  Kylie Lam  39 y.o. female  PRE-OPERATIVE DIAGNOSIS:  M66.88: Spontaneous rupture of other tendons, other sites  POST-OPERATIVE DIAGNOSIS:  Same  PROCEDURE:  PATELLA TENDON REPAIR LEFT-REPEAT  SURGEON:  Kayhan Boardley E Dayden Viverette MD  ASST:    ANESTHESIA:   General  EBL: None  TOURNIQUET TIME: 70 minutes  OPERATIVE FINDINGS: Complete rupture of previous repair of patella fracture and patellar tendon reattachment   She has been advised preoperatively that the MRI showed complete rerupture of her patellar tendon and that there was an absolute need to repair this so that she would have a functional knee.  The risks and benefits were discussed with her at length and answered multiple questions.  I advised her to stay overnight to get IV antibiotics prevent infection as well.  OPERATIVE PROCEDURE: The patient was brought to the operating room and underwent satisfactory general anesthesia in the supine position.  The left leg was prepped and draped in sterile fashion and S mark applied.  Tourniquet was inflated to 300 mmHg.  The previous incision was reopened sharply and blunt dissection carried out through subcutaneous tissue.  Previous visible sutures were removed.  The #2 Orthocord sutures had completely ruptured from the attachment and on the patella.  The patellar tendon remained intact.  There was also be rupture of the medial /lateral capsule.  There was abundant clot.  Clot and fluid were removed and pulse irrigation used to clean the joint out.  All previous sutures were removed.  The raw surface of the patella was curetted and read of clot.  Three #5 Ethibond sutures were then woven through the patellar tendon and brought proximally through the 4 drill holes in the patella.  With the knee in extension the sutures pulled the patella and patellar tendon snugly together again.  These were then tied securely.  Next a the incision was extended slightly distally to the  tibial tubercle.  A drill hole was made from medial to lateral just beneath the tibial tubercle and a number 18-gauge wire passed through this.  This was then wrapped in a figure-of-eight fashion and passed beneath the quadriceps tendon insertion at the top of the patella.  It was then pulled tight and tightened superolaterally and the ends cut.  Pliers were used to tighten and ajust tension.  This provided a good tension band to protect the repair.  The wire was buried in the soft tissue.  After further irrigation the medial lateral retinaculum's were sutured with #2 with 2 Ortho cord.  The subcutaneous tissue was then closed with 0 Quill and the skin was closed with staples.  Dry sterile dressing was applied after half percent Marcaine was placed into the wounds.   The tourniquet was deflated with good return of blood flow to the foot and the removed.  The long knee immobilizer was reapplied.  The patient was awakened and taken to recovery in satisfactory condition.  Deeann Saint, MD

## 2020-08-04 NOTE — Progress Notes (Signed)
Against advice, pt has decided to use a walker and go outside to smoke a cigarette.  IV still in place.  Pt warned she would be stopped by security.

## 2020-08-04 NOTE — Progress Notes (Signed)
Suggested a nicotine patch but patient refused because she said last time she wore 3 at once and that she still needed to go smoke.

## 2020-08-05 ENCOUNTER — Encounter: Payer: Self-pay | Admitting: Specialist

## 2020-08-05 DIAGNOSIS — M6688 Spontaneous rupture of other tendons, other: Secondary | ICD-10-CM | POA: Diagnosis not present

## 2020-08-05 LAB — BASIC METABOLIC PANEL
Anion gap: 7 (ref 5–15)
BUN: 18 mg/dL (ref 6–20)
CO2: 23 mmol/L (ref 22–32)
Calcium: 8.4 mg/dL — ABNORMAL LOW (ref 8.9–10.3)
Chloride: 107 mmol/L (ref 98–111)
Creatinine, Ser: 0.98 mg/dL (ref 0.44–1.00)
GFR, Estimated: >60 mL/min (ref >60)
Glucose, Bld: 107 mg/dL — ABNORMAL HIGH (ref 70–99)
Potassium: 3.7 mmol/L (ref 3.5–5.1)
Sodium: 137 mmol/L (ref 135–145)

## 2020-08-05 MED ORDER — GABAPENTIN 400 MG PO CAPS: 400.0000 mg | ORAL_CAPSULE | Freq: Two times a day (BID) | ORAL | 3 refills | Status: AC

## 2020-08-05 MED ORDER — HYDROCODONE-ACETAMINOPHEN 5-325 MG PO TABS: 1.0000 | ORAL_TABLET | Freq: Four times a day (QID) | ORAL | 0 refills | Status: DC | PRN

## 2020-08-05 MED ORDER — ENOXAPARIN SODIUM 40 MG/0.4ML ~~LOC~~ SOLN: 40.0000 mg | SUBCUTANEOUS | Status: DC

## 2020-08-05 MED ORDER — MELOXICAM 15 MG PO TABS: 15.0000 mg | ORAL_TABLET | Freq: Every day | ORAL | 3 refills | Status: DC

## 2020-08-05 NOTE — Progress Notes (Signed)
Was notified patient was walking through medical mall. Found patient standing outside, was told by another staff member she was smoking. Explained to patient she was at risk for falling and if she left the floor, we would not be difficult to find her if something happened to her.Pt. returned to room without episode.

## 2020-08-05 NOTE — Plan of Care (Signed)

## 2020-08-05 NOTE — Discharge Summary (Signed)
Physician Discharge Summary  Patient ID: Kylie Lam MRN: [id number] DOB/AGE: 1981/03/08 39 y.o.  Admit date: 08/04/2020 Discharge date: 08/05/2020  Admission Diagnoses:  Discharge Diagnoses:  Active Problems:   Patellar tendon rupture, left, initial encounter   Discharged Condition: good  Hospital Course: The patient underwent repeat repair of the left patellar tendon 08/04/2020.  She did well postoperatively.  She was found walking through the medical mall to the outside to smoke yesterday and today.  Dressings remain dry.  Neurovascular status is good.  She is ready for home discharge.  Consults: None  Significant Diagnostic Studies: None  Treatments: antibiotics: Ancef  Discharge Exam: Blood pressure 112/75, pulse 75, temperature 98.3 F (36.8 C), resp. rate 20, height 5\' 8"  (1.727 m), weight 81.6 kg, SpO2 100 %. General appearance: Dressing is dry.  Neurovascular status good distally.  She has been able to ambulate.  Disposition: Discharge disposition: 01-Home or Self Care       Discharge Instructions    Call MD for:  persistant nausea and vomiting   Complete by: As directed    Call MD for:  redness, tenderness, or signs of infection (pain, swelling, redness, odor or green/yellow discharge around incision site)   Complete by: As directed    Call MD for:  severe uncontrolled pain   Complete by: As directed    Call MD for:  temperature >100.4   Complete by: As directed    Diet - low sodium heart healthy   Complete by: As directed    Increase activity slowly   Complete by: As directed    Leave dressing on - Keep it clean, dry, and intact until clinic visit   Complete by: As directed    Partial weight bearing   Complete by: As directed    50%     Allergies as of 08/05/2020      Reactions   Baclofen Other (See Comments)   Dizzy, falling, can't control actions   Haldol [haloperidol Lactate] Other (See Comments)   Stiffness   Prozac [fluoxetine Hcl]     Redness all over body.      Medication List    STOP taking these medications   HYDROcodone-acetaminophen 7.5-325 MG tablet Commonly known as: Norco Replaced by: HYDROcodone-acetaminophen 5-325 MG tablet     TAKE these medications   albuterol 108 (90 Base) MCG/ACT inhaler Commonly known as: VENTOLIN HFA Inhale 1-2 puffs into the lungs every 6 (six) hours as needed for wheezing or shortness of breath.   aspirin EC 81 MG tablet Take 1 tablet (81 mg total) by mouth every 8 (eight) hours.   doxycycline 100 MG capsule Commonly known as: VIBRAMYCIN Take 1 capsule (100 mg total) by mouth 2 (two) times daily.   escitalopram 20 MG tablet Commonly known as: LEXAPRO Take 20 mg by mouth daily.   gabapentin 100 MG capsule Commonly known as: Neurontin Take 1 capsule (100 mg total) by mouth 3 (three) times daily. What changed: Another medication with the same name was added. Make sure you understand how and when to take each.   gabapentin 400 MG capsule Commonly known as: Neurontin Take 1 capsule (400 mg total) by mouth 2 (two) times daily. What changed: You were already taking a medication with the same name, and this prescription was added. Make sure you understand how and when to take each.   HYDROcodone-acetaminophen 5-325 MG tablet Commonly known as: Norco Take 1 tablet by mouth every 6 (six) hours as needed. Replaces: HYDROcodone-acetaminophen 7.5-325  MG tablet   meloxicam 15 MG tablet Commonly known as: MOBIC Take 1 tablet (15 mg total) by mouth daily. What changed: Another medication with the same name was added. Make sure you understand how and when to take each.   meloxicam 15 MG tablet Commonly known as: MOBIC Take 1 tablet (15 mg total) by mouth daily. What changed: You were already taking a medication with the same name, and this prescription was added. Make sure you understand how and when to take each.   pantoprazole 20 MG tablet Commonly known as: PROTONIX Take  20 mg by mouth daily.   pseudoephedrine 30 MG tablet Commonly known as: SUDAFED Take 30 mg by mouth every 4 (four) hours as needed for congestion.   QUEtiapine 400 MG tablet Commonly known as: SEROQUEL Take 400 mg by mouth at bedtime.   trazodone 300 MG tablet Commonly known as: DESYREL Take 300 mg by mouth at bedtime.            Discharge Care Instructions  (From admission, onward)         Start     Ordered   08/05/20 0000  Leave dressing on - Keep it clean, dry, and intact until clinic visit        08/05/20 1212   08/05/20 0000  Partial weight bearing        08/05/20 1212          Follow-up Information    Deeann Saint, MD On 08/11/2020.   Specialty: Orthopedic Surgery Why: at [address] Contact information: [address] [place] [address] [phone]               Signed: [name] 08/05/2020, 12:13 PM

## 2020-08-05 NOTE — Anesthesia Postprocedure Evaluation (Signed)
Anesthesia Post Note  Patient: Kylie Lam  Procedure(s) Performed: PATELLA TENDON REPAIR (Left Knee)  Patient location during evaluation: PACU Anesthesia Type: General Level of consciousness: awake and alert Pain management: pain level controlled Vital Signs Assessment: post-procedure vital signs reviewed and stable Respiratory status: spontaneous breathing, nonlabored ventilation, respiratory function stable and patient connected to nasal cannula oxygen Cardiovascular status: blood pressure returned to baseline and stable Postop Assessment: no apparent nausea or vomiting Anesthetic complications: no   No complications documented.   Last Vitals:  Vitals:   08/05/20 0411 08/05/20 0737  BP: 107/77 103/65  Pulse: 75 79  Resp: 18 20  Temp: 36.8 C (!) 36.4 C  SpO2: 97% 99%    Last Pain:  Vitals:   08/05/20 0521  TempSrc:   PainSc: 8                  Yevette Edwards

## 2020-08-05 NOTE — Evaluation (Signed)
Physical Therapy Evaluation Patient Details Name: Kylie Lam MRN: [id number] DOB: 16-Jun-1981 Today's Date: 08/05/2020   History of Present Illness  Pt is a 39 yo female diagnosed with spontaneous rupture of her left patellar tendon and is s/p repair.  PMH includes recent prior L patellar tendon rupture with prior surgical repair 07/20/20, depression, and PTSD.    Clinical Impression  Pt was motivated to participate during the session and overall performed well with functional tasks.   Pt provided with extensive education on proper sequencing with transfers from various height surfaces, gait, and stairs to ensure WB compliance and pt safety.  Pt demonstrated good control and stability throughout the session and although she was at times somewhat impulsive she responded well to cuing and training and maintained LLE PWB compliance throughout.  Per patient and nursing the patient has been walking outside of hospital to smoke using her RW and taking her IV pole with her.  Extensive pt education provided on risks associated with this activity most notably as a result of unlikely compliance with WB restrictions as well as increased fall risk with attempting to manage the IV pole with a RW.  Pt will benefit from HHPT services upon discharge to safely address deficits listed in patient problem list for decreased caregiver assistance and eventual return to PLOF.       Follow Up Recommendations Home health PT;Supervision - Intermittent    Equipment Recommendations  Rolling walker with 5" wheels    Recommendations for Other Services       Precautions / Restrictions Precautions Precautions: Knee;Fall Required Braces or Orthoses: Knee Immobilizer - Left Knee Immobilizer - Left: On at all times Restrictions Weight Bearing Restrictions: Yes LLE Weight Bearing: Partial weight bearing      Mobility  Bed Mobility Overal bed mobility: Modified Independent             General bed mobility  comments: Min extra time and effort with bed mobility tasks but no physical assistance required    Transfers Overall transfer level: Needs assistance Equipment used: Rolling walker (2 wheeled) Transfers: Sit to/from Stand Sit to Stand: Supervision         General transfer comment: Min verbal cues for sequencing with pt able to demonstrate good eccentric and concentric control and stability from various height surfaces  Ambulation/Gait Ambulation/Gait assistance: Supervision Gait Distance (Feet): 50 Feet Assistive device: Rolling walker (2 wheeled) Gait Pattern/deviations: Step-to pattern;Decreased stance time - left Gait velocity: decreased   General Gait Details: Mod verbal, visual cues for proper sequencing for PWB compliance  Stairs Stairs: Yes Stairs assistance: Supervision Stair Management: Two rails;Step to pattern Number of Stairs: 4 General stair comments: Visual demonstration followed by pt practice with pt able to demonstrate proper sequencing up/down stairs for LLE PWB compliance  Wheelchair Mobility    Modified Rankin (Stroke Patients Only)       Balance Overall balance assessment: Needs assistance;History of Falls Sitting-balance support: Feet supported Sitting balance-Leahy Scale: Normal     Standing balance support: Single extremity supported;During functional activity Standing balance-Leahy Scale: Good                               Pertinent Vitals/Pain Pain Assessment: 0-10 Pain Score: 10-Worst pain ever Pain Location: knee Pain Descriptors / Indicators: Aching;Discomfort;Sore Pain Intervention(s): Premedicated before session;Monitored during session;RN gave pain meds during session;Patient requesting pain meds-RN notified    Home Living Family/patient expects to be  discharged to:: Private residence Living Arrangements: Alone Available Help at Discharge: Family;Available PRN/intermittently Type of Home: Apartment Home Access:  Stairs to enter Entrance Stairs-Rails: Can reach both;Left;Right Entrance Stairs-Number of Steps: 10 Home Layout: One level Home Equipment: None Additional Comments: Pt stated that would have very limited assist from family upon discharge    Prior Function Level of Independence: Independent               Hand Dominance        Extremity/Trunk Assessment   Upper Extremity Assessment Upper Extremity Assessment: Overall WFL for tasks assessed    Lower Extremity Assessment Lower Extremity Assessment: Generalized weakness;LLE deficits/detail LLE: Unable to fully assess due to immobilization       Communication   Communication: No difficulties  Cognition Arousal/Alertness: Awake/alert Behavior During Therapy: WFL for tasks assessed/performed Overall Cognitive Status: Within Functional Limits for tasks assessed                                        General Comments      Exercises Other Exercises Other Exercises: Pt education provided on importance of KI use and WB compliance   Assessment/Plan    PT Assessment Patient needs continued PT services  PT Problem List Decreased strength;Decreased mobility;Pain;Decreased balance;Decreased knowledge of use of DME;Decreased activity tolerance;Decreased knowledge of precautions       PT Treatment Interventions DME instruction;Therapeutic activities;Gait training;Therapeutic exercise;Patient/family education;Stair training;Balance training;Functional mobility training    PT Goals (Current goals can be found in the Care Plan section)  Acute Rehab PT Goals Patient Stated Goal: to go home PT Goal Formulation: With patient Time For Goal Achievement: 08/18/20 Potential to Achieve Goals: Good    Frequency BID   Barriers to discharge        Co-evaluation               AM-PAC PT "6 Clicks" Mobility  Outcome Measure Help needed turning from your back to your side while in a flat bed without using  bedrails?: None Help needed moving from lying on your back to sitting on the side of a flat bed without using bedrails?: None Help needed moving to and from a bed to a chair (including a wheelchair)?: A Little Help needed standing up from a chair using your arms (e.g., wheelchair or bedside chair)?: A Little Help needed to walk in hospital room?: A Little Help needed climbing 3-5 steps with a railing? : A Little 6 Click Score: 20    End of Session Equipment Utilized During Treatment: Left knee immobilizer;Gait belt Activity Tolerance: Patient tolerated treatment well Patient left: in bed;with call bell/phone within reach;with nursing/sitter in room Nurse Communication: Mobility status;Weight bearing status PT Visit Diagnosis: Pain;History of falling (Z91.81);Other abnormalities of gait and mobility (R26.89);Muscle weakness (generalized) (M62.81) Pain - Right/Left: Left Pain - part of body: Knee    Time: 1043-1130 PT Time Calculation (min) (ACUTE ONLY): 47 min   Charges:   PT Evaluation $PT Eval Moderate Complexity: 1 Mod PT Treatments $Gait Training: 8-22 mins $Therapeutic Activity: 8-22 mins        D. Elly Modena PT, DPT 08/05/20, 12:44 PM

## 2020-08-05 NOTE — Progress Notes (Signed)
PHARMACIST - PHYSICIAN COMMUNICATION  CONCERNING:  Enoxaparin (Lovenox) for DVT Prophylaxis    RECOMMENDATION: Patient was prescribed enoxaprin 30mg  q24 hours for VTE prophylaxis.   Filed Weights   08/04/20 0738  Weight: 81.6 kg (180 lb)    Body mass index is 27.37 kg/m.  Estimated Creatinine Clearance: 80.6 mL/min (A) (by C-G formula based on SCr of 1.03 mg/dL (H)).   Based on [facility] policy patient is candidate for enoxaparin 40mg  every 24 hours based on CrCl >79ml/min or Weight >45kg  DESCRIPTION: Pharmacy has adjusted enoxaparin dose per [facility] policy.  Patient is now receiving enoxaparin 40 mg every 24 hours   31m, PharmD, BCPS Clinical Pharmacist 08/05/2020 8:51 AM

## 2020-08-05 NOTE — Progress Notes (Signed)
Discharge note:  Patient discharged home today. Discharge instructions provided to patient. Verbalized understanding. IV removed. Driven home by father.  Arlana Hove, RN

## 2020-08-05 NOTE — Progress Notes (Signed)
Subjective: 1 Day Post-Op Procedure(s) (LRB): PATELLA TENDON REPAIR (Left)   Patient apparently is doing well as she was [place] and outside to smoke yesterday and today.  Dressings are dry.  Neurovascular status is good.  She has had postop antibiotics and is ready for discharge.  Strongly advised her to keep the knee straight at all times and use the knee immobilizer at all times other than lying on the couch or bed with a ice pack.  She is to take aspirin twice a day to thin the blood.  She is to return to the office in 5 days for dressing change.  Patient reports pain as mild.  Objective:   VITALS:   Vitals:   08/05/20 0411 08/05/20 0737  BP: 107/77 103/65  Pulse: 75 79  Resp: 18 20  Temp: 98.2 F (36.8 C) (!) 97.5 F (36.4 C)  SpO2: 97% 99%    Neurologically intact Sensation intact distally Dorsiflexion/Plantar flexion intact Incision: dressing C/D/I  LABS Recent Labs    08/04/20 1526  HGB 11.3*  HCT 34.4*  WBC 10.4  PLT 290    Recent Labs    08/04/20 1526 08/05/20 0826  NA  --  137  K  --  3.7  BUN  --  18  CREATININE 1.03* 0.98  GLUCOSE  --  107*    No results for input(s): LABPT, INR in the last 72 hours.   Assessment/Plan: 1 Day Post-Op Procedure(s) (LRB): PATELLA TENDON REPAIR (Left)   Advance diet Up with therapy   Discharge today with a walker and partial weightbearing status Buffered aspirin twice a day for 6 weeks Partial weightbearing left leg Maintain knee immobilizer at all times Return return to clinic 5 to 6 days for dressing change

## 2020-10-21 ENCOUNTER — Ambulatory Visit
Admission: EM | Admit: 2020-10-21 | Discharge: 2020-10-21 | Disposition: A | Discharge status: Home / Self Care | Payer: No Typology Code available for payment source | Attending: Physician Assistant | Admitting: Physician Assistant

## 2020-10-21 ENCOUNTER — Other Ambulatory Visit: Payer: Self-pay

## 2020-10-21 ENCOUNTER — Encounter: Payer: Self-pay | Admitting: Emergency Medicine

## 2020-10-21 DIAGNOSIS — Z8614 Personal history of Methicillin resistant Staphylococcus aureus infection: Secondary | ICD-10-CM | POA: Diagnosis not present

## 2020-10-21 DIAGNOSIS — M25562 Pain in left knee: Secondary | ICD-10-CM | POA: Diagnosis present

## 2020-10-21 DIAGNOSIS — Z9889 Other specified postprocedural states: Secondary | ICD-10-CM | POA: Insufficient documentation

## 2020-10-21 LAB — CBC WITH DIFFERENTIAL/PLATELET
Abs Immature Granulocytes: 0 10*3/uL (ref 0.00–0.07)
Basophils Absolute: 0 10*3/uL (ref 0.0–0.1)
Basophils Relative: 1 %
Eosinophils Absolute: 0 10*3/uL (ref 0.0–0.5)
Eosinophils Relative: 1 %
HCT: 39.3 % (ref 36.0–46.0)
Hemoglobin: 12.4 g/dL (ref 12.0–15.0)
Immature Granulocytes: 0 %
Lymphocytes Relative: 43 %
Lymphs Abs: 1.8 10*3/uL (ref 0.7–4.0)
MCH: 29.5 pg (ref 26.0–34.0)
MCHC: 31.6 g/dL (ref 30.0–36.0)
MCV: 93.3 fL (ref 80.0–100.0)
Monocytes Absolute: 0.3 10*3/uL (ref 0.1–1.0)
Monocytes Relative: 6 %
Neutro Abs: 2.1 10*3/uL (ref 1.7–7.7)
Neutrophils Relative %: 49 %
Platelets: 239 10*3/uL (ref 150–400)
RBC: 4.21 MIL/uL (ref 3.87–5.11)
RDW: 13.3 % (ref 11.5–15.5)
WBC: 4.3 10*3/uL (ref 4.0–10.5)
nRBC: 0 % (ref 0.0–0.2)

## 2020-10-21 LAB — COMPREHENSIVE METABOLIC PANEL
ALT: 23 U/L (ref 0–44)
AST: 19 U/L (ref 15–41)
Albumin: 3.9 g/dL (ref 3.5–5.0)
Alkaline Phosphatase: 93 U/L (ref 38–126)
Anion gap: 9 (ref 5–15)
BUN: 18 mg/dL (ref 6–20)
CO2: 23 mmol/L (ref 22–32)
Calcium: 9.2 mg/dL (ref 8.9–10.3)
Chloride: 104 mmol/L (ref 98–111)
Creatinine, Ser: 1.09 mg/dL — ABNORMAL HIGH (ref 0.44–1.00)
GFR, Estimated: >60 mL/min (ref >60)
Glucose, Bld: 111 mg/dL — ABNORMAL HIGH (ref 70–99)
Potassium: 4.2 mmol/L (ref 3.5–5.1)
Sodium: 136 mmol/L (ref 135–145)
Total Bilirubin: 0.6 mg/dL (ref 0.3–1.2)
Total Protein: 7.4 g/dL (ref 6.5–8.1)

## 2020-10-21 MED ORDER — OXYCODONE HCL 5 MG PO TABS
5.0000 mg | ORAL_TABLET | Freq: Three times a day (TID) | ORAL | 0 refills | Status: AC | PRN
Start: 2020-10-21 — End: 2020-10-26
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
## 2020-10-21 NOTE — Discharge Instructions (Addendum)
Make sure you complete the entire course of the linezolid.  You need to follow-up with an orthopedist sooner than a month from now.  I am not sure what is going on with your insurance exactly, but you should try to see whoever did your surgery again in the next week or 2.  I have sent oxycodone to the pharmacy.  If he needs something stronger than that you will need to have the ordering/prescribing provider send it in for you or go to the emergency department for evaluation.  If you develop a fever, increase swelling or redness of the knee you need to go to the emergency department.

## 2020-10-21 NOTE — ED Triage Notes (Signed)
Pt is present today with left knee pain. Pt states that she needs something to manage the knee pain. Pt just had a  Surgical procedure on her knee last month and had a MRSA infection. Pt states that she needs a refill on her Dilaudid.

## 2021-01-08 ENCOUNTER — Ambulatory Visit: Payer: Self-pay

## 2021-03-09 DEATH — deceased
